# Patient Record
Sex: Female | Born: 1990 | Race: Black or African American | Hispanic: No | Marital: Single | Smoking: Never smoker
Health system: Southern US, Community
[De-identification: ages and names within clinical notes are randomized; demographics above are authoritative.]

## PROBLEM LIST (undated history)

## (undated) DIAGNOSIS — W3400XA Accidental discharge from unspecified firearms or gun, initial encounter: Secondary | ICD-10-CM

## (undated) HISTORY — PX: SPLENECTOMY: SUR1306

## (undated) HISTORY — PX: EXPLORATORY LAPAROTOMY: SUR591

## (undated) HISTORY — PX: NO PAST SURGERIES: SHX2092

## (undated) HISTORY — PX: CHEST TUBE INSERTION: SHX231

---

## 2019-02-09 HISTORY — PX: OTHER SURGICAL HISTORY: SHX169

## 2019-06-14 ENCOUNTER — Ambulatory Visit: Payer: Medicaid - Out of State | Attending: General Surgery | Admitting: Physical Therapy

## 2019-07-01 ENCOUNTER — Encounter (HOSPITAL_COMMUNITY): Payer: Self-pay | Admitting: Emergency Medicine

## 2019-07-01 ENCOUNTER — Other Ambulatory Visit: Payer: Self-pay

## 2019-07-01 ENCOUNTER — Inpatient Hospital Stay (HOSPITAL_COMMUNITY)
Admission: EM | Admit: 2019-07-01 | Discharge: 2019-07-10 | DRG: 981 | Disposition: A | Discharge status: Home / Self Care | Payer: Self-pay | Attending: Internal Medicine | Admitting: Internal Medicine

## 2019-07-01 ENCOUNTER — Emergency Department (HOSPITAL_COMMUNITY): Payer: Self-pay

## 2019-07-01 DIAGNOSIS — Z9889 Other specified postprocedural states: Secondary | ICD-10-CM

## 2019-07-01 DIAGNOSIS — I313 Pericardial effusion (noninflammatory): Secondary | ICD-10-CM | POA: Diagnosis present

## 2019-07-01 DIAGNOSIS — J942 Hemothorax: Secondary | ICD-10-CM | POA: Diagnosis not present

## 2019-07-01 DIAGNOSIS — E876 Hypokalemia: Secondary | ICD-10-CM | POA: Diagnosis present

## 2019-07-01 DIAGNOSIS — Z189 Retained foreign body fragments, unspecified material: Secondary | ICD-10-CM

## 2019-07-01 DIAGNOSIS — J95811 Postprocedural pneumothorax: Secondary | ICD-10-CM | POA: Diagnosis not present

## 2019-07-01 DIAGNOSIS — Y836 Removal of other organ (partial) (total) as the cause of abnormal reaction of the patient, or of later complication, without mention of misadventure at the time of the procedure: Secondary | ICD-10-CM | POA: Diagnosis present

## 2019-07-01 DIAGNOSIS — Z9081 Acquired absence of spleen: Secondary | ICD-10-CM

## 2019-07-01 DIAGNOSIS — Z09 Encounter for follow-up examination after completed treatment for conditions other than malignant neoplasm: Secondary | ICD-10-CM

## 2019-07-01 DIAGNOSIS — T8182XA Emphysema (subcutaneous) resulting from a procedure, initial encounter: Secondary | ICD-10-CM | POA: Diagnosis not present

## 2019-07-01 DIAGNOSIS — J984 Other disorders of lung: Secondary | ICD-10-CM | POA: Diagnosis present

## 2019-07-01 DIAGNOSIS — Z20828 Contact with and (suspected) exposure to other viral communicable diseases: Secondary | ICD-10-CM | POA: Diagnosis present

## 2019-07-01 DIAGNOSIS — R7989 Other specified abnormal findings of blood chemistry: Secondary | ICD-10-CM | POA: Diagnosis not present

## 2019-07-01 DIAGNOSIS — J869 Pyothorax without fistula: Secondary | ICD-10-CM | POA: Diagnosis present

## 2019-07-01 DIAGNOSIS — D62 Acute posthemorrhagic anemia: Secondary | ICD-10-CM | POA: Diagnosis not present

## 2019-07-01 DIAGNOSIS — J939 Pneumothorax, unspecified: Secondary | ICD-10-CM

## 2019-07-01 DIAGNOSIS — T81500A Unspecified complication of foreign body accidentally left in body following surgical operation, initial encounter: Secondary | ICD-10-CM

## 2019-07-01 DIAGNOSIS — J90 Pleural effusion, not elsewhere classified: Secondary | ICD-10-CM

## 2019-07-01 DIAGNOSIS — T81590A Other complications of foreign body accidentally left in body following surgical operation, initial encounter: Principal | ICD-10-CM | POA: Diagnosis present

## 2019-07-01 DIAGNOSIS — J9600 Acute respiratory failure, unspecified whether with hypoxia or hypercapnia: Secondary | ICD-10-CM | POA: Diagnosis not present

## 2019-07-01 HISTORY — DX: Accidental discharge from unspecified firearms or gun, initial encounter: W34.00XA

## 2019-07-01 LAB — CBC
HCT: 39.7 % (ref 36.0–46.0)
Hemoglobin: 12.8 g/dL (ref 12.0–15.0)
MCH: 27.4 pg (ref 26.0–34.0)
MCHC: 32.2 g/dL (ref 30.0–36.0)
MCV: 84.8 fL (ref 80.0–100.0)
Platelets: 459 10*3/uL — ABNORMAL HIGH (ref 150–400)
RBC: 4.68 MIL/uL (ref 3.87–5.11)
RDW: 14.7 % (ref 11.5–15.5)
WBC: 12.9 10*3/uL — ABNORMAL HIGH (ref 4.0–10.5)
nRBC: 0 % (ref 0.0–0.2)

## 2019-07-01 LAB — I-STAT BETA HCG BLOOD, ED (MC, WL, AP ONLY): I-stat hCG, quantitative: <5 m[IU]/mL (ref <5)

## 2019-07-01 MED ORDER — KETOROLAC TROMETHAMINE 30 MG/ML IJ SOLN: 15.0000 mg | Freq: Once | INTRAMUSCULAR | Status: DC
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
## 2019-07-02 ENCOUNTER — Emergency Department (HOSPITAL_COMMUNITY): Payer: Self-pay

## 2019-07-02 ENCOUNTER — Encounter (HOSPITAL_COMMUNITY): Payer: Self-pay | Admitting: Radiology

## 2019-07-02 DIAGNOSIS — J90 Pleural effusion, not elsewhere classified: Secondary | ICD-10-CM

## 2019-07-02 DIAGNOSIS — T81500A Unspecified complication of foreign body accidentally left in body following surgical operation, initial encounter: Secondary | ICD-10-CM | POA: Diagnosis present

## 2019-07-02 LAB — BLOOD GAS, ARTERIAL
Acid-Base Excess: 2.8 mmol/L — ABNORMAL HIGH (ref 0.0–2.0)
Bicarbonate: 26.4 mmol/L (ref 20.0–28.0)
Drawn by: 51702
FIO2: 21
O2 Saturation: 97.3 %
Patient temperature: 37
pCO2 arterial: 37.6 mmHg (ref 32.0–48.0)
pH, Arterial: 7.461 — ABNORMAL HIGH (ref 7.350–7.450)
pO2, Arterial: 88.4 mmHg (ref 83.0–108.0)

## 2019-07-02 LAB — GLUCOSE, CAPILLARY
Glucose-Capillary: 87 mg/dL (ref 70–99)
Glucose-Capillary: 113 mg/dL — ABNORMAL HIGH (ref 70–99)
Glucose-Capillary: 97 mg/dL (ref 70–99)

## 2019-07-02 LAB — COMPREHENSIVE METABOLIC PANEL
ALT: 10 U/L (ref 0–44)
AST: 14 U/L — ABNORMAL LOW (ref 15–41)
Albumin: 3.1 g/dL — ABNORMAL LOW (ref 3.5–5.0)
Alkaline Phosphatase: 60 U/L (ref 38–126)
Anion gap: 9 (ref 5–15)
BUN: <5 mg/dL — ABNORMAL LOW (ref 6–20)
CO2: 24 mmol/L (ref 22–32)
Calcium: 8.7 mg/dL — ABNORMAL LOW (ref 8.9–10.3)
Chloride: 105 mmol/L (ref 98–111)
Creatinine, Ser: 0.75 mg/dL (ref 0.44–1.00)
GFR calc Af Amer: >60 mL/min (ref >60)
GFR calc non Af Amer: >60 mL/min (ref >60)
Glucose, Bld: 96 mg/dL (ref 70–99)
Potassium: 3.8 mmol/L (ref 3.5–5.1)
Sodium: 138 mmol/L (ref 135–145)
Total Bilirubin: 0.5 mg/dL (ref 0.3–1.2)
Total Protein: 6.6 g/dL (ref 6.5–8.1)

## 2019-07-02 LAB — BASIC METABOLIC PANEL
Anion gap: 9 (ref 5–15)
BUN: 9 mg/dL (ref 6–20)
CO2: 29 mmol/L (ref 22–32)
Calcium: 9.4 mg/dL (ref 8.9–10.3)
Chloride: 99 mmol/L (ref 98–111)
Creatinine, Ser: 0.92 mg/dL (ref 0.44–1.00)
GFR calc Af Amer: >60 mL/min (ref >60)
GFR calc non Af Amer: >60 mL/min (ref >60)
Glucose, Bld: 90 mg/dL (ref 70–99)
Potassium: 3.2 mmol/L — ABNORMAL LOW (ref 3.5–5.1)
Sodium: 137 mmol/L (ref 135–145)

## 2019-07-02 LAB — TROPONIN I (HIGH SENSITIVITY)
Troponin I (High Sensitivity): 3 ng/L (ref <18)
Troponin I (High Sensitivity): 3 ng/L (ref <18)

## 2019-07-02 LAB — CBC
HCT: 33.5 % — ABNORMAL LOW (ref 36.0–46.0)
Hemoglobin: 11 g/dL — ABNORMAL LOW (ref 12.0–15.0)
MCH: 27.6 pg (ref 26.0–34.0)
MCHC: 32.8 g/dL (ref 30.0–36.0)
MCV: 84.2 fL (ref 80.0–100.0)
Platelets: 410 10*3/uL — ABNORMAL HIGH (ref 150–400)
RBC: 3.98 MIL/uL (ref 3.87–5.11)
RDW: 14.8 % (ref 11.5–15.5)
WBC: 13 10*3/uL — ABNORMAL HIGH (ref 4.0–10.5)
nRBC: 0 % (ref 0.0–0.2)

## 2019-07-02 LAB — APTT: aPTT: 36 seconds (ref 24–36)

## 2019-07-02 LAB — ABO/RH: ABO/RH(D): O POS

## 2019-07-02 LAB — HIV ANTIBODY (ROUTINE TESTING W REFLEX): HIV Screen 4th Generation wRfx: NONREACTIVE

## 2019-07-02 LAB — MRSA PCR SCREENING: MRSA by PCR: NEGATIVE

## 2019-07-02 LAB — PROTIME-INR
INR: 1.1 (ref 0.8–1.2)
Prothrombin Time: 14.5 seconds (ref 11.4–15.2)

## 2019-07-02 LAB — SARS CORONAVIRUS 2 (TAT 6-24 HRS): SARS Coronavirus 2: NEGATIVE

## 2019-07-02 MED ORDER — ACETAMINOPHEN 325 MG PO TABS: 650.0000 mg | ORAL_TABLET | Freq: Four times a day (QID) | ORAL | Status: DC | PRN

## 2019-07-02 MED ORDER — KETOROLAC TROMETHAMINE 30 MG/ML IJ SOLN
30.0000 mg | Freq: Four times a day (QID) | INTRAMUSCULAR | Status: AC
  Administered 2019-07-03 – 2019-07-07 (×17): 30 mg via INTRAVENOUS
  Filled 2019-07-02 (×17): qty 1

## 2019-07-02 MED ORDER — ONDANSETRON HCL 4 MG/2ML IJ SOLN: 4.0000 mg | Freq: Four times a day (QID) | INTRAMUSCULAR | Status: DC | PRN

## 2019-07-02 MED ORDER — POTASSIUM CHLORIDE CRYS ER 20 MEQ PO TBCR
40.0000 meq | EXTENDED_RELEASE_TABLET | Freq: Once | ORAL | Status: AC
  Administered 2019-07-02: 12:00:00 40 meq via ORAL
  Filled 2019-07-02: qty 2

## 2019-07-02 MED ORDER — ACETAMINOPHEN 650 MG RE SUPP: 650.0000 mg | Freq: Four times a day (QID) | RECTAL | Status: DC | PRN

## 2019-07-02 MED ORDER — SODIUM CHLORIDE 0.9 % IV BOLUS
1000.0000 mL | Freq: Once | INTRAVENOUS | Status: AC
  Administered 2019-07-02: 04:00:00 1000 mL via INTRAVENOUS

## 2019-07-02 MED ORDER — FENTANYL CITRATE (PF) 100 MCG/2ML IJ SOLN
50.0000 ug | Freq: Once | INTRAMUSCULAR | Status: AC
  Administered 2019-07-02: 01:00:00 50 ug via INTRAVENOUS
  Filled 2019-07-02: qty 2

## 2019-07-02 MED ORDER — OXYCODONE HCL 5 MG PO TABS
5.0000 mg | ORAL_TABLET | ORAL | Status: DC | PRN
  Administered 2019-07-06 – 2019-07-10 (×10): 5 mg via ORAL
  Filled 2019-07-02 (×11): qty 1

## 2019-07-02 MED ORDER — ONDANSETRON HCL 4 MG PO TABS: 4.0000 mg | ORAL_TABLET | Freq: Four times a day (QID) | ORAL | Status: DC | PRN

## 2019-07-02 MED ORDER — KETOROLAC TROMETHAMINE 30 MG/ML IJ SOLN
30.0000 mg | Freq: Once | INTRAMUSCULAR | Status: AC
  Administered 2019-07-02: 01:00:00 30 mg via INTRAVENOUS
  Filled 2019-07-02: qty 1

## 2019-07-02 MED ORDER — FENTANYL CITRATE (PF) 100 MCG/2ML IJ SOLN
50.0000 ug | INTRAMUSCULAR | Status: DC | PRN
  Administered 2019-07-02: 04:00:00 50 ug via INTRAVENOUS
  Filled 2019-07-02: qty 2

## 2019-07-02 MED ORDER — HYDROMORPHONE HCL 1 MG/ML IJ SOLN: 0.5000 mg | INTRAMUSCULAR | Status: DC | PRN

## 2019-07-02 MED ORDER — IOHEXOL 350 MG/ML SOLN
75.0000 mL | Freq: Once | INTRAVENOUS | Status: AC | PRN
  Administered 2019-07-02: 01:00:00 75 mL via INTRAVENOUS

## 2019-07-02 MED ORDER — KCL IN DEXTROSE-NACL 20-5-0.9 MEQ/L-%-% IV SOLN
INTRAVENOUS | Status: DC
  Administered 2019-07-02: 100 mL/h via INTRAVENOUS
  Filled 2019-07-02 (×3): qty 1000

## 2019-07-02 MED ORDER — SODIUM CHLORIDE 0.9 % IV SOLN: INTRAVENOUS | Status: DC

## 2019-07-02 MED ORDER — VANCOMYCIN HCL IN DEXTROSE 1-5 GM/200ML-% IV SOLN
1000.0000 mg | Freq: Once | INTRAVENOUS | Status: AC
  Administered 2019-07-02: 06:00:00 1000 mg via INTRAVENOUS
  Filled 2019-07-02: qty 200

## 2019-07-02 MED ORDER — PIPERACILLIN-TAZOBACTAM 3.375 G IVPB 30 MIN
3.3750 g | Freq: Once | INTRAVENOUS | Status: AC
  Administered 2019-07-02: 04:00:00 3.375 g via INTRAVENOUS
  Filled 2019-07-02: qty 50

## 2019-07-02 MED ORDER — CEFAZOLIN SODIUM-DEXTROSE 2-4 GM/100ML-% IV SOLN
2.0000 g | INTRAVENOUS | Status: AC
  Administered 2019-07-03: 2 g via INTRAVENOUS

## 2019-07-02 MED ORDER — POTASSIUM CHLORIDE CRYS ER 20 MEQ PO TBCR: 30.0000 meq | EXTENDED_RELEASE_TABLET | Freq: Once | ORAL | Status: DC

## 2019-07-02 MED ORDER — LACTATED RINGERS IV BOLUS
1000.0000 mL | Freq: Once | INTRAVENOUS | Status: AC
  Administered 2019-07-02: 1000 mL via INTRAVENOUS
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
## 2019-07-02 NOTE — Plan of Care (Signed)
Patient is progressing as expected and waiting for surgery today.
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
## 2019-07-03 ENCOUNTER — Inpatient Hospital Stay (HOSPITAL_COMMUNITY): Payer: Self-pay | Admitting: Certified Registered"

## 2019-07-03 ENCOUNTER — Encounter (HOSPITAL_COMMUNITY)
Admission: EM | Disposition: A | Discharge status: Home / Self Care | Payer: Self-pay | Source: Home / Self Care | Attending: Internal Medicine

## 2019-07-03 ENCOUNTER — Encounter (HOSPITAL_COMMUNITY): Payer: Self-pay | Admitting: Internal Medicine

## 2019-07-03 ENCOUNTER — Inpatient Hospital Stay (HOSPITAL_COMMUNITY): Payer: Self-pay

## 2019-07-03 DIAGNOSIS — Z189 Retained foreign body fragments, unspecified material: Secondary | ICD-10-CM

## 2019-07-03 DIAGNOSIS — J90 Pleural effusion, not elsewhere classified: Secondary | ICD-10-CM

## 2019-07-03 HISTORY — PX: VIDEO ASSISTED THORACOSCOPY: SHX5073

## 2019-07-03 HISTORY — PX: THORACOTOMY: SHX5074

## 2019-07-03 LAB — CBC
HCT: 33.3 % — ABNORMAL LOW (ref 36.0–46.0)
Hemoglobin: 11 g/dL — ABNORMAL LOW (ref 12.0–15.0)
MCH: 27.6 pg (ref 26.0–34.0)
MCHC: 33 g/dL (ref 30.0–36.0)
MCV: 83.5 fL (ref 80.0–100.0)
Platelets: 395 10*3/uL (ref 150–400)
RBC: 3.99 MIL/uL (ref 3.87–5.11)
RDW: 14.7 % (ref 11.5–15.5)
WBC: 9.2 10*3/uL (ref 4.0–10.5)
nRBC: 0 % (ref 0.0–0.2)

## 2019-07-03 LAB — BASIC METABOLIC PANEL
Anion gap: 8 (ref 5–15)
BUN: <5 mg/dL — ABNORMAL LOW (ref 6–20)
CO2: 25 mmol/L (ref 22–32)
Calcium: 8.7 mg/dL — ABNORMAL LOW (ref 8.9–10.3)
Chloride: 105 mmol/L (ref 98–111)
Creatinine, Ser: 0.72 mg/dL (ref 0.44–1.00)
GFR calc Af Amer: >60 mL/min (ref >60)
GFR calc non Af Amer: >60 mL/min (ref >60)
Glucose, Bld: 100 mg/dL — ABNORMAL HIGH (ref 70–99)
Potassium: 3.9 mmol/L (ref 3.5–5.1)
Sodium: 138 mmol/L (ref 135–145)

## 2019-07-03 SURGERY — VIDEO ASSISTED THORACOSCOPY
Anesthesia: General | Site: Chest | Laterality: Left

## 2019-07-03 MED ORDER — ALBUMIN HUMAN 5 % IV SOLN: INTRAVENOUS | Status: DC | PRN

## 2019-07-03 MED ORDER — DIPHENHYDRAMINE HCL 50 MG/ML IJ SOLN: 12.5000 mg | Freq: Four times a day (QID) | INTRAMUSCULAR | Status: DC | PRN

## 2019-07-03 MED ORDER — SUGAMMADEX SODIUM 200 MG/2ML IV SOLN
INTRAVENOUS | Status: DC | PRN
  Administered 2019-07-03: 100 mg via INTRAVENOUS

## 2019-07-03 MED ORDER — SODIUM CHLORIDE 0.9 % IV SOLN: INTRAVENOUS | Status: DC | PRN

## 2019-07-03 MED ORDER — LIDOCAINE 2% (20 MG/ML) 5 ML SYRINGE
INTRAMUSCULAR | Status: AC
  Filled 2019-07-03: qty 5

## 2019-07-03 MED ORDER — BUPIVACAINE LIPOSOME 1.3 % IJ SUSP
INTRAMUSCULAR | Status: DC | PRN
  Administered 2019-07-03: 60 mL

## 2019-07-03 MED ORDER — MEPERIDINE HCL 25 MG/ML IJ SOLN
INTRAMUSCULAR | Status: AC
  Filled 2019-07-03: qty 1

## 2019-07-03 MED ORDER — SODIUM CHLORIDE 0.9 % IV SOLN: INTRAVENOUS | Status: DC

## 2019-07-03 MED ORDER — DEXAMETHASONE SODIUM PHOSPHATE 10 MG/ML IJ SOLN
INTRAMUSCULAR | Status: DC | PRN
  Administered 2019-07-03: 10 mg via INTRAVENOUS

## 2019-07-03 MED ORDER — ROCURONIUM BROMIDE 50 MG/5ML IV SOSY
PREFILLED_SYRINGE | INTRAVENOUS | Status: DC | PRN
  Administered 2019-07-03 (×2): 20 mg via INTRAVENOUS
  Administered 2019-07-03: 80 mg via INTRAVENOUS

## 2019-07-03 MED ORDER — HYDROMORPHONE HCL 1 MG/ML IJ SOLN: 0.2500 mg | INTRAMUSCULAR | Status: DC | PRN

## 2019-07-03 MED ORDER — ONDANSETRON HCL 4 MG/2ML IJ SOLN
INTRAMUSCULAR | Status: DC | PRN
  Administered 2019-07-03: 4 mg via INTRAVENOUS

## 2019-07-03 MED ORDER — MIDAZOLAM HCL 5 MG/5ML IJ SOLN
INTRAMUSCULAR | Status: DC | PRN
  Administered 2019-07-03: 2 mg via INTRAVENOUS

## 2019-07-03 MED ORDER — HYDROMORPHONE HCL 1 MG/ML IJ SOLN
INTRAMUSCULAR | Status: AC
  Filled 2019-07-03: qty 0.5

## 2019-07-03 MED ORDER — ROCURONIUM BROMIDE 10 MG/ML (PF) SYRINGE
PREFILLED_SYRINGE | INTRAVENOUS | Status: AC
  Filled 2019-07-03: qty 10

## 2019-07-03 MED ORDER — PHENYLEPHRINE 40 MCG/ML (10ML) SYRINGE FOR IV PUSH (FOR BLOOD PRESSURE SUPPORT)
PREFILLED_SYRINGE | INTRAVENOUS | Status: AC
  Filled 2019-07-03: qty 10

## 2019-07-03 MED ORDER — MEPERIDINE HCL 25 MG/ML IJ SOLN
6.2500 mg | INTRAMUSCULAR | Status: DC | PRN
  Administered 2019-07-03: 12:00:00 6.25 mg via INTRAVENOUS

## 2019-07-03 MED ORDER — ONDANSETRON HCL 4 MG/2ML IJ SOLN
INTRAMUSCULAR | Status: AC
  Filled 2019-07-03: qty 2

## 2019-07-03 MED ORDER — BISACODYL 5 MG PO TBEC
10.0000 mg | DELAYED_RELEASE_TABLET | Freq: Every day | ORAL | Status: DC
  Filled 2019-07-03 (×2): qty 2

## 2019-07-03 MED ORDER — FENTANYL CITRATE (PF) 250 MCG/5ML IJ SOLN
INTRAMUSCULAR | Status: AC
  Filled 2019-07-03: qty 5

## 2019-07-03 MED ORDER — SUCCINYLCHOLINE CHLORIDE 200 MG/10ML IV SOSY
PREFILLED_SYRINGE | INTRAVENOUS | Status: AC
  Filled 2019-07-03: qty 10

## 2019-07-03 MED ORDER — HYDROMORPHONE HCL 1 MG/ML IJ SOLN
INTRAMUSCULAR | Status: DC | PRN
  Administered 2019-07-03: .5 mg via INTRAVENOUS

## 2019-07-03 MED ORDER — CEFAZOLIN SODIUM 1 G IJ SOLR
INTRAMUSCULAR | Status: AC
  Filled 2019-07-03: qty 20

## 2019-07-03 MED ORDER — BUPIVACAINE HCL (PF) 0.5 % IJ SOLN
INTRAMUSCULAR | Status: AC
  Filled 2019-07-03: qty 30

## 2019-07-03 MED ORDER — DEXAMETHASONE SODIUM PHOSPHATE 10 MG/ML IJ SOLN
INTRAMUSCULAR | Status: AC
  Filled 2019-07-03: qty 1

## 2019-07-03 MED ORDER — SENNOSIDES-DOCUSATE SODIUM 8.6-50 MG PO TABS
1.0000 | ORAL_TABLET | Freq: Every day | ORAL | Status: DC
  Filled 2019-07-03: qty 1

## 2019-07-03 MED ORDER — MIDAZOLAM HCL 2 MG/2ML IJ SOLN
INTRAMUSCULAR | Status: AC
  Filled 2019-07-03: qty 2

## 2019-07-03 MED ORDER — ENOXAPARIN SODIUM 40 MG/0.4ML ~~LOC~~ SOLN: 40.0000 mg | Freq: Every day | SUBCUTANEOUS | Status: DC

## 2019-07-03 MED ORDER — BUPIVACAINE LIPOSOME 1.3 % IJ SUSP
20.0000 mL | INTRAMUSCULAR | Status: DC
  Filled 2019-07-03: qty 20

## 2019-07-03 MED ORDER — CHLORHEXIDINE GLUCONATE CLOTH 2 % EX PADS
6.0000 | MEDICATED_PAD | Freq: Every day | CUTANEOUS | Status: DC
  Administered 2019-07-03 – 2019-07-07 (×5): 6 via TOPICAL

## 2019-07-03 MED ORDER — ACETAMINOPHEN 500 MG PO TABS
1000.0000 mg | ORAL_TABLET | Freq: Four times a day (QID) | ORAL | Status: AC
  Administered 2019-07-03 – 2019-07-08 (×18): 1000 mg via ORAL
  Filled 2019-07-03 (×17): qty 2

## 2019-07-03 MED ORDER — CEFAZOLIN SODIUM-DEXTROSE 1-4 GM/50ML-% IV SOLN
1.0000 g | Freq: Three times a day (TID) | INTRAVENOUS | Status: AC
  Administered 2019-07-03 – 2019-07-06 (×9): 1 g via INTRAVENOUS
  Filled 2019-07-03 (×9): qty 50

## 2019-07-03 MED ORDER — SODIUM CHLORIDE 0.9% FLUSH: 9.0000 mL | INTRAVENOUS | Status: DC | PRN

## 2019-07-03 MED ORDER — ACETAMINOPHEN 160 MG/5ML PO SOLN: 1000.0000 mg | Freq: Four times a day (QID) | ORAL | Status: AC

## 2019-07-03 MED ORDER — NALOXONE HCL 0.4 MG/ML IJ SOLN: 0.4000 mg | INTRAMUSCULAR | Status: DC | PRN

## 2019-07-03 MED ORDER — LIDOCAINE 2% (20 MG/ML) 5 ML SYRINGE
INTRAMUSCULAR | Status: DC | PRN
  Administered 2019-07-03: 100 mg via INTRAVENOUS

## 2019-07-03 MED ORDER — DIPHENHYDRAMINE HCL 12.5 MG/5ML PO ELIX
12.5000 mg | ORAL_SOLUTION | Freq: Four times a day (QID) | ORAL | Status: DC | PRN
  Filled 2019-07-03: qty 5

## 2019-07-03 MED ORDER — FENTANYL CITRATE (PF) 100 MCG/2ML IJ SOLN
INTRAMUSCULAR | Status: DC | PRN
  Administered 2019-07-03 (×3): 50 ug via INTRAVENOUS
  Administered 2019-07-03: 150 ug via INTRAVENOUS
  Administered 2019-07-03 (×2): 50 ug via INTRAVENOUS
  Administered 2019-07-03: 100 ug via INTRAVENOUS
  Administered 2019-07-03: 50 ug via INTRAVENOUS

## 2019-07-03 MED ORDER — PROPOFOL 10 MG/ML IV BOLUS
INTRAVENOUS | Status: DC | PRN
  Administered 2019-07-03: 110 mg via INTRAVENOUS

## 2019-07-03 MED ORDER — GLYCOPYRROLATE PF 0.2 MG/ML IJ SOSY
PREFILLED_SYRINGE | INTRAMUSCULAR | Status: AC
  Filled 2019-07-03: qty 1

## 2019-07-03 MED ORDER — ONDANSETRON HCL 4 MG/2ML IJ SOLN
4.0000 mg | Freq: Four times a day (QID) | INTRAMUSCULAR | Status: DC | PRN
  Administered 2019-07-03 – 2019-07-05 (×2): 4 mg via INTRAVENOUS
  Filled 2019-07-03 (×2): qty 2

## 2019-07-03 MED ORDER — LACTATED RINGERS IV SOLN: INTRAVENOUS | Status: DC | PRN

## 2019-07-03 MED ORDER — PROPOFOL 10 MG/ML IV BOLUS
INTRAVENOUS | Status: AC
  Filled 2019-07-03: qty 20

## 2019-07-03 MED ORDER — HYDROMORPHONE 1 MG/ML IV SOLN
INTRAVENOUS | Status: DC
  Administered 2019-07-03: 0.6 mg via INTRAVENOUS
  Administered 2019-07-03: 30 mg via INTRAVENOUS
  Administered 2019-07-03 – 2019-07-04 (×4): 0.3 mg via INTRAVENOUS
  Administered 2019-07-04 (×3): 0.6 mg via INTRAVENOUS
  Administered 2019-07-04: 0.3 mg via INTRAVENOUS
  Administered 2019-07-05 (×2): 0 mg via INTRAVENOUS
  Administered 2019-07-05 – 2019-07-06 (×4): 0.3 mg via INTRAVENOUS
  Administered 2019-07-06 (×3): 0 mg via INTRAVENOUS
  Administered 2019-07-06 (×2): 0.3 mg via INTRAVENOUS
  Administered 2019-07-07: 0 mg via INTRAVENOUS
  Administered 2019-07-07: 0.3 mg via INTRAVENOUS
  Filled 2019-07-03: qty 30

## 2019-07-03 MED ORDER — PHENYLEPHRINE HCL-NACL 10-0.9 MG/250ML-% IV SOLN
INTRAVENOUS | Status: DC | PRN
  Administered 2019-07-03: 25 ug/min via INTRAVENOUS

## 2019-07-03 MED ORDER — GLYCOPYRROLATE PF 0.2 MG/ML IJ SOSY
PREFILLED_SYRINGE | INTRAMUSCULAR | Status: DC | PRN
  Administered 2019-07-03: .1 mg via INTRAVENOUS

## 2019-07-03 MED ORDER — 0.9 % SODIUM CHLORIDE (POUR BTL) OPTIME
TOPICAL | Status: DC | PRN
  Administered 2019-07-03: 07:00:00 1000 mL

## 2019-07-03 MED ORDER — PHENYLEPHRINE 40 MCG/ML (10ML) SYRINGE FOR IV PUSH (FOR BLOOD PRESSURE SUPPORT)
PREFILLED_SYRINGE | INTRAVENOUS | Status: DC | PRN
  Administered 2019-07-03: 40 ug via INTRAVENOUS

## 2019-07-03 MED ORDER — ONDANSETRON HCL 4 MG/2ML IJ SOLN: 4.0000 mg | Freq: Once | INTRAMUSCULAR | Status: DC | PRN

## 2019-07-03 SURGICAL SUPPLY — 88 items
CANISTER SUCT 3000ML PPV (MISCELLANEOUS) ×8 IMPLANT
CATH THORACIC 28FR (CATHETERS) IMPLANT
CATH THORACIC 28FR RT ANG (CATHETERS) IMPLANT
CATH THORACIC 36FR (CATHETERS) IMPLANT
CATH THORACIC 36FR RT ANG (CATHETERS) IMPLANT
CLIP VESOCCLUDE MED 6/CT (CLIP) ×4 IMPLANT
CONN ST 1/4X3/8  BEN (MISCELLANEOUS) ×2
CONN ST 1/4X3/8 BEN (MISCELLANEOUS) IMPLANT
CONN Y 3/8X3/8X3/8  BEN (MISCELLANEOUS)
CONN Y 3/8X3/8X3/8 BEN (MISCELLANEOUS) ×2 IMPLANT
CONT SPEC 4OZ CLIKSEAL STRL BL (MISCELLANEOUS) ×4 IMPLANT
COVER SURGICAL LIGHT HANDLE (MISCELLANEOUS) ×4 IMPLANT
CUTTER ECHEON FLEX ENDO 45 340 (ENDOMECHANICALS) ×6 IMPLANT
DERMABOND ADHESIVE PROPEN (GAUZE/BANDAGES/DRESSINGS) ×2
DERMABOND ADVANCED (GAUZE/BANDAGES/DRESSINGS) ×2
DERMABOND ADVANCED .7 DNX12 (GAUZE/BANDAGES/DRESSINGS) IMPLANT
DERMABOND ADVANCED .7 DNX6 (GAUZE/BANDAGES/DRESSINGS) IMPLANT
DRAIN CHANNEL 28F RND 3/8 FF (WOUND CARE) ×4 IMPLANT
DRAIN CHANNEL 32F RND 10.7 FF (WOUND CARE) IMPLANT
DRAPE CV SPLIT W-CLR ANES SCRN (DRAPES) ×4 IMPLANT
DRAPE ORTHO SPLIT 77X108 STRL (DRAPES) ×4
DRAPE SLUSH/WARMER DISC (DRAPES) ×4 IMPLANT
DRAPE SURG ORHT 6 SPLT 77X108 (DRAPES) ×2 IMPLANT
ELECT BLADE 6.5 EXT (BLADE) ×4 IMPLANT
ELECT REM PT RETURN 9FT ADLT (ELECTROSURGICAL) ×4
ELECTRODE REM PT RTRN 9FT ADLT (ELECTROSURGICAL) ×2 IMPLANT
FELT TEFLON 1X6 (MISCELLANEOUS) ×2 IMPLANT
GAUZE SPONGE 4X4 12PLY STRL (GAUZE/BANDAGES/DRESSINGS) ×4 IMPLANT
GLOVE BIOGEL PI IND STRL 6.5 (GLOVE) IMPLANT
GLOVE BIOGEL PI INDICATOR 6.5 (GLOVE) ×4
GLOVE SURG SIGNA 7.5 PF LTX (GLOVE) ×12 IMPLANT
GLOVE TRIUMPH SURG SIZE 7.5 (KITS) ×4 IMPLANT
GOWN STRL REUS W/ TWL LRG LVL3 (GOWN DISPOSABLE) ×4 IMPLANT
GOWN STRL REUS W/ TWL XL LVL3 (GOWN DISPOSABLE) ×2 IMPLANT
GOWN STRL REUS W/TWL LRG LVL3 (GOWN DISPOSABLE) ×4
GOWN STRL REUS W/TWL XL LVL3 (GOWN DISPOSABLE) ×2
HEMOSTAT SURGICEL 2X14 (HEMOSTASIS) IMPLANT
IV CATH 22GX1 FEP (IV SOLUTION) IMPLANT
KIT BASIN OR (CUSTOM PROCEDURE TRAY) ×4 IMPLANT
KIT SUCTION CATH 14FR (SUCTIONS) ×4 IMPLANT
KIT TURNOVER KIT B (KITS) ×4 IMPLANT
NDL HYPO 25GX1X1/2 BEV (NEEDLE) ×2 IMPLANT
NDL SPNL 18GX3.5 QUINCKE PK (NEEDLE) IMPLANT
NEEDLE HYPO 25GX1X1/2 BEV (NEEDLE) ×4 IMPLANT
NEEDLE SPNL 18GX3.5 QUINCKE PK (NEEDLE) IMPLANT
NS IRRIG 1000ML POUR BTL (IV SOLUTION) ×12 IMPLANT
PACK CHEST (CUSTOM PROCEDURE TRAY) ×4 IMPLANT
RELOAD STAPLE 45 GOLD REG/THCK (STAPLE) IMPLANT
SOL ANTI FOG 6CC (MISCELLANEOUS) ×2 IMPLANT
SOLUTION ANTI FOG 6CC (MISCELLANEOUS) ×2
SPONGE INTESTINAL PEANUT (DISPOSABLE) ×10 IMPLANT
STAPLE RELOAD 45MM GOLD (STAPLE) ×4 IMPLANT
STAPLER VISISTAT 35W (STAPLE) IMPLANT
STOPCOCK 4 WAY LG BORE MALE ST (IV SETS) ×6 IMPLANT
SUT PROLENE 4 0 RB 1 (SUTURE) ×2
SUT PROLENE 4 0 SH DA (SUTURE) ×2 IMPLANT
SUT PROLENE 4-0 RB1 .5 CRCL 36 (SUTURE) IMPLANT
SUT SILK  1 MH (SUTURE) ×2
SUT SILK 1 MH (SUTURE) ×4 IMPLANT
SUT VIC AB 1 CTX 36 (SUTURE) ×4
SUT VIC AB 1 CTX36XBRD ANBCTR (SUTURE) IMPLANT
SUT VIC AB 2-0 CT1 27 (SUTURE) ×2
SUT VIC AB 2-0 CT1 TAPERPNT 27 (SUTURE) IMPLANT
SUT VIC AB 2-0 CTX 36 (SUTURE) IMPLANT
SUT VIC AB 2-0 UR6 27 (SUTURE) IMPLANT
SUT VIC AB 3-0 SH 18 (SUTURE) IMPLANT
SUT VIC AB 3-0 SH 27 (SUTURE)
SUT VIC AB 3-0 SH 27X BRD (SUTURE) IMPLANT
SUT VIC AB 3-0 X1 27 (SUTURE) ×8 IMPLANT
SUT VICRYL 0 UR6 27IN ABS (SUTURE) ×4 IMPLANT
SUT VICRYL 2 TP 1 (SUTURE) IMPLANT
SWAB COLLECTION DEVICE MRSA (MISCELLANEOUS) ×2 IMPLANT
SWAB CULTURE ESWAB REG 1ML (MISCELLANEOUS) IMPLANT
SYR 10ML LL (SYRINGE) ×4 IMPLANT
SYR 20ML LL LF (SYRINGE) ×2 IMPLANT
SYR 50ML LL SCALE MARK (SYRINGE) ×4 IMPLANT
SYR CONTROL 10ML LL (SYRINGE) ×2 IMPLANT
SYSTEM SAHARA CHEST DRAIN ATS (WOUND CARE) ×4 IMPLANT
TAPE CLOTH 4X10 WHT NS (GAUZE/BANDAGES/DRESSINGS) ×4 IMPLANT
TAPE CLOTH SURG 4X10 WHT LF (GAUZE/BANDAGES/DRESSINGS) ×2 IMPLANT
TIP APPLICATOR SPRAY EXTEND 16 (VASCULAR PRODUCTS) IMPLANT
TOWEL GREEN STERILE (TOWEL DISPOSABLE) ×4 IMPLANT
TOWEL GREEN STERILE FF (TOWEL DISPOSABLE) ×4 IMPLANT
TRAP SPECIMEN MUCOUS 40CC (MISCELLANEOUS) ×2 IMPLANT
TRAY FOLEY SLVR 16FR LF STAT (SET/KITS/TRAYS/PACK) ×4 IMPLANT
TROCAR XCEL BLADELESS 5X75MML (TROCAR) ×4 IMPLANT
TUBING EXTENTION W/L.L. (IV SETS) ×6 IMPLANT
WATER STERILE IRR 1000ML POUR (IV SOLUTION) ×8 IMPLANT

## 2019-07-03 NOTE — Anesthesia Procedure Notes (Signed)
Anesthesia Procedure Image    

## 2019-07-03 NOTE — Brief Op Note (Addendum)
07/01/2019 - 07/03/2019  11:27 AM  PATIENT:  Cassandra Bowen  28 y.o. female  PRE-OPERATIVE DIAGNOSIS:  Left pleural effusion, possible retained foreign body  POST-OPERATIVE DIAGNOSIS:  Left pleural effusion, retained foreign body  PROCEDURE:  Procedure(s): -Left VATS, thoracotomy, drainage of pleural effusion, removal of foreign body with decortication -Intercostal nerve block   SURGEON:  Surgeon(s) and Role:    [name], MD - Primary  PHYSICIAN ASSISTANT: [place]  ANESTHESIA:   general  EBL:  200 mL   BLOOD ADMINISTERED:none  DRAINS: 31fr Blake drains x 2   LOCAL MEDICATIONS USED:  Exparel  SPECIMEN: Pleural peel, foreign body  DISPOSITION OF SPECIMEN:  PATHOLOGY  COUNTS:  YES  DICTATION: .Dragon Dictation  PLAN OF CARE: Admit to inpatient   PATIENT DISPOSITION:  PACU - hemodynamically stable.   Delay start of Pharmacological VTE agent (>24hrs) due to surgical blood loss or risk of bleeding: yes

## 2019-07-03 NOTE — Anesthesia Procedure Notes (Addendum)
Procedure Name: Intubation Date/Time: 07/03/2019 7:52 AM Performed by: Orlie Dakin, CRNA Pre-anesthesia Checklist: Patient identified, Emergency Drugs available, Suction available, Patient being monitored and Timeout performed Patient Re-evaluated:Patient Re-evaluated prior to induction Oxygen Delivery Method: Circle system utilized Preoxygenation: Pre-oxygenation with 100% oxygen Induction Type: IV induction Ventilation: Mask ventilation without difficulty Laryngoscope Size: Mac and 3 Grade View: Grade I Tube type: Oral Endobronchial tube: Left, Double lumen EBT and EBT position confirmed by auscultation and 35 Fr Number of attempts: 1 Airway Equipment and Method: Stylet and Fiberoptic brochoscope Placement Confirmation: ETT inserted through vocal cords under direct vision,  positive ETCO2 and breath sounds checked- equal and bilateral Tube secured with: Tape Dental Injury: Teeth and Oropharynx as per pre-operative assessment  Comments: Viva-sight EBT with camera used.

## 2019-07-03 NOTE — Anesthesia Procedure Notes (Signed)
Central Venous Catheter Insertion Performed by: Myrtie Soman, MD, anesthesiologist Start/End12/23/2020 6:55 AM, 07/03/2019 7:11 AM Patient location: Pre-op. Preanesthetic checklist: patient identified, IV checked, site marked, risks and benefits discussed, surgical consent, monitors and equipment checked, pre-op evaluation, timeout performed and anesthesia consent Lidocaine 1% used for infiltration and patient sedated Hand hygiene performed  and maximum sterile barriers used  Catheter size: 8 Fr Total catheter length 16. Central line was placed.Double lumen Procedure performed using ultrasound guided technique. Ultrasound Notes:anatomy identified, needle tip was noted to be adjacent to the nerve/plexus identified, no ultrasound evidence of intravascular and/or intraneural injection and image(s) printed for medical record Attempts: 1 Following insertion, dressing applied, line sutured and Biopatch. Post procedure assessment: blood return through all ports  Patient tolerated the procedure well with no immediate complications.

## 2019-07-03 NOTE — Anesthesia Postprocedure Evaluation (Signed)
Anesthesia Post Note  Patient: Ellamae Lybeck  Procedure(s) Performed: Left VATS, thoracotomy, drainage of pleural effusion, removal of foreign body (Left Chest) possible THORACOTOMY (Left )     Patient location during evaluation: PACU Anesthesia Type: General Level of consciousness: awake and alert Pain management: pain level controlled Vital Signs Assessment: post-procedure vital signs reviewed and stable Respiratory status: spontaneous breathing, nonlabored ventilation, respiratory function stable and patient connected to nasal cannula oxygen Cardiovascular status: blood pressure returned to baseline and stable Postop Assessment: no apparent nausea or vomiting Anesthetic complications: no    Last Vitals:  Vitals:   07/03/19 1515 07/03/19 1535  BP: 96/67 (!) 103/57  Pulse:    Resp: 20 (!) 22  Temp:    SpO2:      Last Pain:  Vitals:   07/03/19 1408  TempSrc:   PainSc: 0-No pain                 Ozie Dimaria DAVID

## 2019-07-03 NOTE — Progress Notes (Signed)
Patient report was called to OR Agricultural consultant at [phone]. Consent was signed and on the patient's chart,

## 2019-07-03 NOTE — Transfer of Care (Signed)
Immediate Anesthesia Transfer of Care Note  Patient: Cassandra Bowen  Procedure(s) Performed: Left VATS, thoracotomy, drainage of pleural effusion, removal of foreign body (Left Chest) possible THORACOTOMY (Left )  Patient Location: PACU  Anesthesia Type:General  Level of Consciousness: awake and patient cooperative  Airway & Oxygen Therapy: Patient Spontanous Breathing and Patient connected to face mask oxygen  Post-op Assessment: Report given to RN and Post -op Vital signs reviewed and stable  Post vital signs: Reviewed and stable  Last Vitals:  Vitals Value Taken Time  BP 101/64 07/03/19 1157  Temp    Pulse 114 07/03/19 1200  Resp 16 07/03/19 1200  SpO2 100 % 07/03/19 1200  Vitals shown include unvalidated device data.  Last Pain:  Vitals:   07/03/19 0350  TempSrc: Oral  PainSc:          Complications: No apparent anesthesia complications

## 2019-07-03 NOTE — Interval H&P Note (Signed)
History and Physical Interval Note:  07/03/2019 7:26 AM  Cassandra Bowen  has presented today for surgery, with the diagnosis of LEFT PLEURAL EFFUSION.  The various methods of treatment have been discussed with the patient and family. After consideration of risks, benefits and other options for treatment, the patient has consented to  Procedure(s): VIDEO ASSISTED THORACOSCOPY (Left) DRAINAGE OF PLEURAL EFFUSION (Left) possible THORACOTOMY (Left) as a surgical intervention.  [name] history has been reviewed, patient examined, no change in status, stable for surgery.  I have reviewed the patient's chart and labs.  Questions were answered to the patient's satisfaction.     [name]

## 2019-07-03 NOTE — Anesthesia Procedure Notes (Addendum)
Arterial Line Insertion Start/End12/23/2020 7:13 AM, 07/03/2019 7:20 AM Performed by: Myrtie Soman, MD  Patient location: Pre-op. Preanesthetic checklist: patient identified, IV checked, site marked, risks and benefits discussed, surgical consent, monitors and equipment checked, pre-op evaluation, timeout performed and anesthesia consent Lidocaine 1% used for infiltration Left, radial was placed Catheter size: 20 Fr Hand hygiene performed  and maximum sterile barriers used   Attempts: 3 Procedure performed using ultrasound guided technique. Ultrasound Notes:anatomy identified, needle tip was noted to be adjacent to the nerve/plexus identified, no ultrasound evidence of intravascular and/or intraneural injection and image(s) printed for medical record Following insertion, dressing applied. Post procedure assessment: normal and unchanged  Patient tolerated the procedure well with no immediate complications. Additional procedure comments: crna attempt x 2 [place] used by me for L radial placement.

## 2019-07-03 NOTE — Anesthesia Preprocedure Evaluation (Addendum)
Anesthesia Evaluation  Patient identified by MRN, date of birth, ID band Patient awake    Reviewed: Allergy & Precautions, NPO status , Patient's Chart, lab work & pertinent test results  Airway Mallampati: I  TM Distance: >3 FB Neck ROM: Full    Dental  (+) Dental Advisory Given, Teeth Intact   Pulmonary    Pulmonary exam normal        Cardiovascular Normal cardiovascular exam     Neuro/Psych    GI/Hepatic   Endo/Other    Renal/GU      Musculoskeletal   Abdominal   Peds  Hematology   Anesthesia Other Findings   Reproductive/Obstetrics                            Anesthesia Physical Anesthesia Plan  ASA: III  Anesthesia Plan: General   Post-op Pain Management:    Induction: Intravenous  PONV Risk Score and Plan: 3 and Midazolam, Ondansetron and Dexamethasone  Airway Management Planned: Double Lumen EBT  Additional Equipment: Arterial line, Ultrasound Guidance Line Placement and CVP  Intra-op Plan:   Post-operative Plan: Extubation in OR  Informed Consent: I have reviewed the patients History and Physical, chart, labs and discussed the procedure including the risks, benefits and alternatives for the proposed anesthesia with the patient or authorized representative who has indicated his/her understanding and acceptance.       Plan Discussed with: CRNA and Surgeon  Anesthesia Plan Comments:         Anesthesia Quick Evaluation

## 2019-07-03 NOTE — OR Nursing (Addendum)
Two laparotomy sponges removed intraoperatively from left pleural space and sent to pathology; left in place from previous surgery; Post op portable chest Xray performed in OR; negative for foreign body per Dr. Roxan Hockey

## 2019-07-03 NOTE — Plan of Care (Signed)
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
## 2019-07-04 ENCOUNTER — Inpatient Hospital Stay (HOSPITAL_COMMUNITY): Payer: Self-pay

## 2019-07-04 LAB — BASIC METABOLIC PANEL
Anion gap: 9 (ref 5–15)
BUN: 5 mg/dL — ABNORMAL LOW (ref 6–20)
CO2: 25 mmol/L (ref 22–32)
Calcium: 8.5 mg/dL — ABNORMAL LOW (ref 8.9–10.3)
Chloride: 104 mmol/L (ref 98–111)
Creatinine, Ser: 0.55 mg/dL (ref 0.44–1.00)
GFR calc Af Amer: >60 mL/min (ref >60)
GFR calc non Af Amer: >60 mL/min (ref >60)
Glucose, Bld: 107 mg/dL — ABNORMAL HIGH (ref 70–99)
Potassium: 3.7 mmol/L (ref 3.5–5.1)
Sodium: 138 mmol/L (ref 135–145)

## 2019-07-04 LAB — BLOOD GAS, ARTERIAL
Acid-Base Excess: 4.3 mmol/L — ABNORMAL HIGH (ref 0.0–2.0)
Bicarbonate: 28.3 mmol/L — ABNORMAL HIGH (ref 20.0–28.0)
FIO2: 21
O2 Saturation: 98.2 %
Patient temperature: 37
pCO2 arterial: 42.4 mmHg (ref 32.0–48.0)
pH, Arterial: 7.44 (ref 7.350–7.450)
pO2, Arterial: 106 mmHg (ref 83.0–108.0)

## 2019-07-04 LAB — CBC
HCT: 20.8 % — ABNORMAL LOW (ref 36.0–46.0)
Hemoglobin: 6.8 g/dL — CL (ref 12.0–15.0)
MCH: 28.1 pg (ref 26.0–34.0)
MCHC: 32.7 g/dL (ref 30.0–36.0)
MCV: 86 fL (ref 80.0–100.0)
Platelets: 282 10*3/uL (ref 150–400)
RBC: 2.42 MIL/uL — ABNORMAL LOW (ref 3.87–5.11)
RDW: 14.9 % (ref 11.5–15.5)
WBC: 14.3 10*3/uL — ABNORMAL HIGH (ref 4.0–10.5)
nRBC: 0 % (ref 0.0–0.2)

## 2019-07-04 LAB — PREPARE RBC (CROSSMATCH)

## 2019-07-04 MED ORDER — SODIUM CHLORIDE 0.9% IV SOLUTION: Freq: Once | INTRAVENOUS | Status: DC

## 2019-07-04 MED ORDER — ENOXAPARIN SODIUM 40 MG/0.4ML ~~LOC~~ SOLN
40.0000 mg | Freq: Every day | SUBCUTANEOUS | Status: DC
  Administered 2019-07-04 – 2019-07-08 (×5): 40 mg via SUBCUTANEOUS
  Filled 2019-07-04 (×6): qty 0.4
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
## 2019-07-04 NOTE — Progress Notes (Signed)
Removed F-cath and A-line. No complications. HS [employer]
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
## 2019-07-04 NOTE — Progress Notes (Signed)
Final unit of PRBC completed at approximately 2040.  Vital signs obtained within 30 minutes of blood infusions completion. No abnormal vital signs noted nor any adverse reactions. Patient states she feels "fine".  Blood bank information placed in shadow chart.  Will continue to monitor patient for any adverse transfusion reactions.

## 2019-07-05 ENCOUNTER — Inpatient Hospital Stay (HOSPITAL_COMMUNITY): Payer: Self-pay

## 2019-07-05 LAB — BPAM RBC
Blood Product Expiration Date: 202101232359
Blood Product Expiration Date: 202101232359
ISSUE DATE / TIME: 202012241414
ISSUE DATE / TIME: 202012241707
Unit Type and Rh: 5100
Unit Type and Rh: 5100

## 2019-07-05 LAB — COMPREHENSIVE METABOLIC PANEL
ALT: 8 U/L (ref 0–44)
AST: 15 U/L (ref 15–41)
Albumin: 2.5 g/dL — ABNORMAL LOW (ref 3.5–5.0)
Alkaline Phosphatase: 39 U/L (ref 38–126)
Anion gap: 9 (ref 5–15)
BUN: 5 mg/dL — ABNORMAL LOW (ref 6–20)
CO2: 27 mmol/L (ref 22–32)
Calcium: 8.1 mg/dL — ABNORMAL LOW (ref 8.9–10.3)
Chloride: 104 mmol/L (ref 98–111)
Creatinine, Ser: 0.6 mg/dL (ref 0.44–1.00)
GFR calc Af Amer: >60 mL/min (ref >60)
GFR calc non Af Amer: >60 mL/min (ref >60)
Glucose, Bld: 90 mg/dL (ref 70–99)
Potassium: 3.2 mmol/L — ABNORMAL LOW (ref 3.5–5.1)
Sodium: 140 mmol/L (ref 135–145)
Total Bilirubin: 0.2 mg/dL — ABNORMAL LOW (ref 0.3–1.2)
Total Protein: 4.8 g/dL — ABNORMAL LOW (ref 6.5–8.1)

## 2019-07-05 LAB — TYPE AND SCREEN
ABO/RH(D): O POS
Antibody Screen: NEGATIVE
Unit division: 0
Unit division: 0

## 2019-07-05 LAB — CBC
HCT: 27 % — ABNORMAL LOW (ref 36.0–46.0)
Hemoglobin: 9.3 g/dL — ABNORMAL LOW (ref 12.0–15.0)
MCH: 29.2 pg (ref 26.0–34.0)
MCHC: 34.4 g/dL (ref 30.0–36.0)
MCV: 84.6 fL (ref 80.0–100.0)
Platelets: 279 10*3/uL (ref 150–400)
RBC: 3.19 MIL/uL — ABNORMAL LOW (ref 3.87–5.11)
RDW: 14.6 % (ref 11.5–15.5)
WBC: 11.7 10*3/uL — ABNORMAL HIGH (ref 4.0–10.5)
nRBC: 0.2 % (ref 0.0–0.2)

## 2019-07-05 MED ORDER — POTASSIUM CHLORIDE CRYS ER 20 MEQ PO TBCR
40.0000 meq | EXTENDED_RELEASE_TABLET | Freq: Two times a day (BID) | ORAL | Status: AC
  Administered 2019-07-05 (×2): 40 meq via ORAL
  Filled 2019-07-05 (×2): qty 2

## 2019-07-05 MED ORDER — POTASSIUM CHLORIDE CRYS ER 20 MEQ PO TBCR
40.0000 meq | EXTENDED_RELEASE_TABLET | Freq: Once | ORAL | Status: AC
  Administered 2019-07-05: 16:00:00 40 meq via ORAL
  Filled 2019-07-05: qty 2

## 2019-07-05 MED ORDER — POTASSIUM CHLORIDE CRYS ER 20 MEQ PO TBCR
30.0000 meq | EXTENDED_RELEASE_TABLET | Freq: Once | ORAL | Status: AC
  Administered 2019-07-05: 30 meq via ORAL
  Filled 2019-07-05: qty 1
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
## 2019-07-05 NOTE — Plan of Care (Signed)

## 2019-07-06 ENCOUNTER — Inpatient Hospital Stay (HOSPITAL_COMMUNITY): Payer: Self-pay

## 2019-07-06 NOTE — Progress Notes (Signed)
Contacted [facility] on-call provider regarding to verify whether patient is in need for lab orders this AM. Patient currently has none ordered.  Awaiting confirmation at this time.
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
## 2019-07-06 NOTE — Progress Notes (Signed)
[address]       [place] [address]             [phone]      3 Days Post-Op Procedure(s) (LRB): Left VATS, thoracotomy, drainage of pleural effusion, removal of foreign body (Left) possible THORACOTOMY (Left) Subjective: Much bigger air leak today, was on H2O seal- will place to 10 cm suction  Objective: Vital signs in last 24 hours: Temp:  [97.8 F (36.6 C)-98.2 F (36.8 C)] 98.2 F (36.8 C) (12/26 0917) Pulse Rate:  [82-107] 86 (12/26 0917) Cardiac Rhythm: Normal sinus rhythm (12/26 0817) Resp:  [16-26] 19 (12/26 0917) BP: (96-112)/(64-74) 105/72 (12/26 0917) SpO2:  [90 %-100 %] 100 % (12/26 0917)  Hemodynamic parameters for last 24 hours:    Intake/Output from previous day: 12/25 0701 - 12/26 0700 In: 1181.3 [P.O.:600; I.V.:431.3; IV Piggyback:150] Out: 110 [Chest Tube:110] Intake/Output this shift: Total I/O In: 67 [I.V.:67] Out: 0   General appearance: alert, cooperative and no distress Heart: regular rate and rhythm Lungs: clear to auscultation bilaterally and + sq air Abdomen: benign Extremities: no edema or calf tenderness Wound: incis healing well  Lab Results: Recent Labs    07/04/19 0410 07/05/19 0402  WBC 14.3* 11.7*  HGB 6.8* 9.3*  HCT 20.8* 27.0*  PLT 282 279   BMET:  Recent Labs    07/04/19 0410 07/05/19 0402  NA 138 140  K 3.7 3.2*  CL 104 104  CO2 25 27  GLUCOSE 107* 90  BUN 5* 5*  CREATININE 0.55 0.60  CALCIUM 8.5* 8.1*    PT/INR: No results for input(s): LABPROT, INR in the last 72 hours. ABG    Component Value Date/Time   PHART 7.440 07/04/2019 0142   HCO3 28.3 (H) 07/04/2019 0142   O2SAT 98.2 07/04/2019 0142   CBG (last 3)  No results for input(s): GLUCAP in the last 72 hours.  Meds Scheduled Meds: . sodium chloride   Intravenous Once  . acetaminophen  1,000 mg Oral Q6H   Or  . acetaminophen (TYLENOL) oral liquid 160 mg/5 mL  1,000 mg Oral Q6H  . bisacodyl  10 mg Oral Daily  .  Chlorhexidine Gluconate Cloth  6 each Topical Daily  . enoxaparin (LOVENOX) injection  40 mg Subcutaneous Daily  . HYDROmorphone   Intravenous Q4H  . ketorolac  30 mg Intravenous Q6H  . senna-docusate  1 tablet Oral QHS   Continuous Infusions: . sodium chloride 20 mL/hr at 07/06/19 0800   PRN Meds:.diphenhydrAMINE **OR** diphenhydrAMINE, naloxone **AND** sodium chloride flush, ondansetron (ZOFRAN) IV, oxyCODONE  Xrays DG Chest 2 View  Result Date: 07/06/2019 CLINICAL DATA:  Status post left VATS, foreign body removal and decortication. EXAM: CHEST - 2 VIEW COMPARISON:  07/05/2019 FINDINGS: Stable positioning of 2 left-sided chest tubes and right-sided central line. It is difficult to appreciate a pneumothorax in the frontal projection but an anterior pneumothorax is visible in the lateral projection likely around 10-15% volume. Stable residual atelectasis of the left lower lung. The right lung remains clear. IMPRESSION: Anterior pneumothorax in the lateral projection roughly 10-15% volume. Stable residual atelectasis of the left lower lung. Electronically Signed   By: Aletta Edouard M.D.   On: 07/06/2019 08:57   DG Chest Port 1 View  Result Date: 07/05/2019 CLINICAL DATA:  Status post left VATS, foreign body removal and decortication. EXAM: PORTABLE CHEST 1 VIEW COMPARISON:  07/04/2019 FINDINGS: Stable heart size. Stable positioning of right jugular central  line and 2 separate left-sided chest tubes. No further definite pneumothorax visualized. There remains atelectasis much of the mid to lower left lung. Stable to slightly less prominent subcutaneous air in the left chest wall and base of left neck. The right lung remains normally aerated. IMPRESSION: No further definite left pneumothorax visualized. Stable atelectasis of the mid to lower left lung. Electronically Signed   By: Irish Lack M.D.   On: 07/05/2019 09:36    Assessment/Plan: S/P Procedure(s) (LRB): Left VATS, thoracotomy,  drainage of pleural effusion, removal of foreign body (Left) possible THORACOTOMY (Left)  1 large air leak today, more sq air- place to 10 cm H2O suction- keep both tubes for now 2 no new labs 3 medical management per [facility]ospitalists 4 afebrile, VSS  LOS: 4 days    Rowe Clack [facility] 07/06/2019 Pager [phone]

## 2019-07-07 ENCOUNTER — Inpatient Hospital Stay (HOSPITAL_COMMUNITY): Payer: Self-pay

## 2019-07-07 LAB — BASIC METABOLIC PANEL
Anion gap: 6 (ref 5–15)
BUN: 9 mg/dL (ref 6–20)
CO2: 26 mmol/L (ref 22–32)
Calcium: 8.5 mg/dL — ABNORMAL LOW (ref 8.9–10.3)
Chloride: 103 mmol/L (ref 98–111)
Creatinine, Ser: 0.59 mg/dL (ref 0.44–1.00)
GFR calc Af Amer: >60 mL/min (ref >60)
GFR calc non Af Amer: >60 mL/min (ref >60)
Glucose, Bld: 88 mg/dL (ref 70–99)
Potassium: 3.8 mmol/L (ref 3.5–5.1)
Sodium: 135 mmol/L (ref 135–145)

## 2019-07-07 LAB — CBC
HCT: 31.7 % — ABNORMAL LOW (ref 36.0–46.0)
Hemoglobin: 10.8 g/dL — ABNORMAL LOW (ref 12.0–15.0)
MCH: 29.6 pg (ref 26.0–34.0)
MCHC: 34.1 g/dL (ref 30.0–36.0)
MCV: 86.8 fL (ref 80.0–100.0)
Platelets: 444 10*3/uL — ABNORMAL HIGH (ref 150–400)
RBC: 3.65 MIL/uL — ABNORMAL LOW (ref 3.87–5.11)
RDW: 14.5 % (ref 11.5–15.5)
WBC: 9.9 10*3/uL (ref 4.0–10.5)
nRBC: 0.4 % — ABNORMAL HIGH (ref 0.0–0.2)

## 2019-07-07 LAB — CULTURE, BLOOD (ROUTINE X 2)
Culture: NO GROWTH
Culture: NO GROWTH
Special Requests: ADEQUATE

## 2019-07-07 MED ORDER — TRAMADOL HCL 50 MG PO TABS
50.0000 mg | ORAL_TABLET | Freq: Four times a day (QID) | ORAL | Status: DC | PRN
  Administered 2019-07-07: 18:00:00 50 mg via ORAL
  Filled 2019-07-07: qty 1

## 2019-07-07 NOTE — Progress Notes (Signed)
Devona Konig RN aware of d/c CVC order.

## 2019-07-07 NOTE — Progress Notes (Signed)
[address]       [place] [address]             [phone]      4 Days Post-Op Procedure(s) (LRB): Left VATS, thoracotomy, drainage of pleural effusion, removal of foreign body (Left) possible THORACOTOMY (Left) Subjective: Feels ok, tachy with activity  Objective: Vital signs in last 24 hours: Temp:  [97.8 F (36.6 C)-98.5 F (36.9 C)] 98 F (36.7 C) (12/27 0756) Pulse Rate:  [89-111] 93 (12/27 0756) Cardiac Rhythm: Normal sinus rhythm (12/27 0756) Resp:  [17-26] 18 (12/27 0756) BP: (99-113)/(60-76) 103/60 (12/27 0756) SpO2:  [96 %-100 %] 99 % (12/27 0756)  Hemodynamic parameters for last 24 hours:    Intake/Output from previous day: 12/26 0701 - 12/27 0700 In: 547 [P.O.:480; I.V.:67] Out: 50 [Chest Tube:50] Intake/Output this shift: No intake/output data recorded.  General appearance: alert, cooperative and no distress Heart: regular rate and rhythm and tachy Lungs: mildly dim left base Abdomen: benign Extremities: no edema Wound: incis healing well  Lab Results: Recent Labs    07/05/19 0402 07/07/19 0423  WBC 11.7* 9.9  HGB 9.3* 10.8*  HCT 27.0* 31.7*  PLT 279 444*   BMET:  Recent Labs    07/05/19 0402 07/07/19 0423  NA 140 135  K 3.2* 3.8  CL 104 103  CO2 27 26  GLUCOSE 90 88  BUN 5* 9  CREATININE 0.60 0.59  CALCIUM 8.1* 8.5*    PT/INR: No results for input(s): LABPROT, INR in the last 72 hours. ABG    Component Value Date/Time   PHART 7.440 07/04/2019 0142   HCO3 28.3 (H) 07/04/2019 0142   O2SAT 98.2 07/04/2019 0142   CBG (last 3)  No results for input(s): GLUCAP in the last 72 hours.  Meds Scheduled Meds: . sodium chloride   Intravenous Once  . acetaminophen  1,000 mg Oral Q6H   Or  . acetaminophen (TYLENOL) oral liquid 160 mg/5 mL  1,000 mg Oral Q6H  . bisacodyl  10 mg Oral Daily  . Chlorhexidine Gluconate Cloth  6 each Topical Daily  . enoxaparin (LOVENOX) injection  40 mg Subcutaneous Daily  .  HYDROmorphone   Intravenous Q4H  . ketorolac  30 mg Intravenous Q6H  . senna-docusate  1 tablet Oral QHS   Continuous Infusions: . sodium chloride 20 mL/hr at 07/06/19 0800   PRN Meds:.diphenhydrAMINE **OR** diphenhydrAMINE, naloxone **AND** sodium chloride flush, ondansetron (ZOFRAN) IV, oxyCODONE  Xrays DG Chest 2 View  Result Date: 07/06/2019 CLINICAL DATA:  Status post left VATS, foreign body removal and decortication. EXAM: CHEST - 2 VIEW COMPARISON:  07/05/2019 FINDINGS: Stable positioning of 2 left-sided chest tubes and right-sided central line. It is difficult to appreciate a pneumothorax in the frontal projection but an anterior pneumothorax is visible in the lateral projection likely around 10-15% volume. Stable residual atelectasis of the left lower lung. The right lung remains clear. IMPRESSION: Anterior pneumothorax in the lateral projection roughly 10-15% volume. Stable residual atelectasis of the left lower lung. Electronically Signed   By: Aletta Edouard M.D.   On: 07/06/2019 08:57   DG CHEST PORT 1 VIEW  Result Date: 07/07/2019 CLINICAL DATA:  Cardio respiratory status. EXAM: PORTABLE CHEST 1 VIEW COMPARISON:  07/06/2019 FINDINGS: Right IJ central venous catheter unchanged with tip over the cavoatrial junction. Two left-sided chest tubes unchanged. Suggestion of a tiny left apical pneumothorax. Surgical suture line over the left upper lung. Persistent hazy opacification over  the left mid to lower lung with slight improved aeration in the left base. Right lung is clear. Cardiomediastinal silhouette and remainder of the exam is unchanged. IMPRESSION: 1. Persistent hazy density over the left mid to lower lung with slight improved aeration in the left base. Suggestion of a tiny left apical pneumothorax. 2.  Tubes and lines as described. Electronically Signed   By: Elberta Fortis M.D.   On: 07/07/2019 08:41    Assessment/Plan: S/P Procedure(s) (LRB): Left VATS, thoracotomy,  drainage of pleural effusion, removal of foreign body (Left) possible THORACOTOMY (Left)  1 doing well 2 sinus tachy mostly reactive- tol well , could consider beta blocker if becomes symptomatic 3 not much PCA use- d/c, cont po analgesics 4 d/c central line, has peripheral 5 smaller air leak, tiny pntx- will d/c one chest tube today, cont 10 cm H2O suction 6 normal renal fxn 7 H/H improved 8 no leukocytosis 9 mild reactive thrombocytosis 10 medical management per hospitalists    LOS: 5 days    Rowe Clack PA-C 07/07/2019
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
## 2019-07-08 ENCOUNTER — Encounter: Payer: Self-pay | Admitting: *Deleted

## 2019-07-08 ENCOUNTER — Inpatient Hospital Stay (HOSPITAL_COMMUNITY): Payer: Self-pay

## 2019-07-08 LAB — AEROBIC/ANAEROBIC CULTURE W GRAM STAIN (SURGICAL/DEEP WOUND): Culture: NO GROWTH

## 2019-07-08 NOTE — Plan of Care (Signed)
  Problem: Education: Goal: Knowledge of General Education information will improve Description: Including pain rating scale, medication(s)/side effects and non-pharmacologic comfort measures Outcome: Progressing   Problem: Clinical Measurements: Goal: Will remain free from infection Outcome: Progressing   Problem: Clinical Measurements: Goal: Respiratory complications will improve Outcome: Progressing   Problem: Clinical Measurements: Goal: Cardiovascular complication will be avoided Outcome: Progressing   Problem: Activity: Goal: Risk for activity intolerance will decrease Outcome: Progressing   Problem: Nutrition: Goal: Adequate nutrition will be maintained Outcome: Progressing   Problem: Elimination: Goal: Will not experience complications related to bowel motility Outcome: Progressing
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
## 2019-07-08 NOTE — Progress Notes (Signed)
5 Days Post-Op Procedure(s) (LRB): Left VATS, thoracotomy, drainage of pleural effusion, removal of foreign body (Left) possible THORACOTOMY (Left) Subjective: Feeling better, pain controlled. No shortness of breath.   Objective: Vital signs in last 24 hours: Temp:  [97.6 F (36.4 C)-98.5 F (36.9 C)] 98.2 F (36.8 C) (12/28 0744) Pulse Rate:  [87-107] 89 (12/28 0744) Cardiac Rhythm: Normal sinus rhythm (12/28 0700) Resp:  [18-24] 19 (12/28 0744) BP: (90-109)/(62-75) 90/62 (12/28 0744) SpO2:  [98 %-100 %] 98 % (12/28 0744)     Intake/Output from previous day: 12/27 0701 - 12/28 0700 In: 723.5 [P.O.:250; I.V.:473.5] Out: -  Intake/Output this shift: No intake/output data recorded.  Physical Exam: General appearance: no distress Neurologic: intact Heart: regular rhythm, sinus tach. Lungs: Breath sounds clear. No air leak, CT drainage not documented past 24 hours. Wound: Clean dry dressing over the left thoracotomy incision.  Lab Results: Recent Labs    07/07/19 0423  WBC 9.9  HGB 10.8*  HCT 31.7*  PLT 444*   BMET:  Recent Labs    07/07/19 0423  NA 135  K 3.8  CL 103  CO2 26  GLUCOSE 88  BUN 9  CREATININE 0.59  CALCIUM 8.5*    PT/INR: No results for input(s): LABPROT, INR in the last 72 hours. ABG    Component Value Date/Time   PHART 7.440 07/04/2019 0142   HCO3 28.3 (H) 07/04/2019 0142   O2SAT 98.2 07/04/2019 0142   CBG (last 3)  No results for input(s): GLUCAP in the last 72 hours.  PORTABLE CHEST 1 VIEW  COMPARISON:  Chest x-ray 07/07/2019 and prior chest CT 07/02/2019  FINDINGS: The more medial chest tube is been removed. The lateral chest tube is still in place. No definite pneumothorax.  Stable surgical changes involving the left lung.  Stable left-sided subcutaneous emphysema.  The right IJ central venous catheter has been removed.  Persistent left lower lobe interstitial and airspace process, likely combination of  atelectasis, edema and pulmonary hemorrhage. The right lung remains clear.  IMPRESSION: 1. Removal of the more medial left-sided chest tube. No definite pneumothorax. 2. Persistent interstitial and airspace process in the left lung. The right lung remains clear.   Electronically Signed   By: Marijo Sanes M.D.   On: 07/08/2019 07:34  Assessment/Plan: S/P Procedure(s) (LRB): Left VATS, thoracotomy, drainage of pleural effusion, removal of foreign body (Left) possible THORACOTOMY (Left)  -POD4 left VATS / thoracotomy for removal of retained FB. Respiratory status stable, pain controlled. No air leak. Medial CT removed yesterday, remaining CT to suction. Very mild subQ air appears to be dissipating. WBC has normalized and operative cultures are negative at 4 days. Possibly remove remaining tube today. Continue to encourage pulmonary hygiene.  -Expected acute blood loss anemia-transfused POD-1, Hct stable.  -DVT PPX- to start  Lovenox this evening.    LOS: 6 days    Antony Odea, PA-C [phone] 07/08/2019

## 2019-07-09 ENCOUNTER — Inpatient Hospital Stay (HOSPITAL_COMMUNITY): Payer: Self-pay

## 2019-07-09 LAB — SURGICAL PATHOLOGY
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
## 2019-07-09 NOTE — Plan of Care (Signed)

## 2019-07-09 NOTE — Progress Notes (Addendum)
      [id number]       [place] [address]             [phone]      6 Days Post-Op Procedure(s) (LRB): Left VATS, thoracotomy, drainage of pleural effusion, removal of foreign body (Left) THORACOTOMY (Left) Subjective: Resting in bed, denies pain or shortness of breath.   Objective: Vital signs in last 24 hours: Temp:  [97.8 F (36.6 C)-98.4 F (36.9 C)] 97.9 F (36.6 C) (12/29 0336) Pulse Rate:  [81-88] 81 (12/29 0336) Cardiac Rhythm: Normal sinus rhythm (12/29 0700) Resp:  [17-19] 19 (12/29 0336) BP: (91-97)/(61-78) 97/61 (12/29 0336) SpO2:  [98 %-99 %] 98 % (12/29 0336)     Intake/Output from previous day: 12/28 0701 - 12/29 0700 In: 360 [P.O.:360] Out: -  Intake/Output this shift: No intake/output data recorded.  Physical Exam: General appearance:no distress Neurologic:intact Heart:regular rhythm, sinus tach. Lungs:Breath sounds clear. O2 sat 98% on RA. CT removed yesterday. CXR this AM shows 35mm left apical PTX.  Left lower lobe density / ATX without significant change.  Wound:the left thoracotomy incision is clean and dry, open to air.   Lab Results: Recent Labs    07/07/19 0423  WBC 9.9  HGB 10.8*  HCT 31.7*  PLT 444*   BMET:  Recent Labs    07/07/19 0423  NA 135  K 3.8  CL 103  CO2 26  GLUCOSE 88  BUN 9  CREATININE 0.59  CALCIUM 8.5*    PT/INR: No results for input(s): LABPROT, INR in the last 72 hours. ABG    Component Value Date/Time   PHART 7.440 07/04/2019 0142   HCO3 28.3 (H) 07/04/2019 0142   O2SAT 98.2 07/04/2019 0142   CBG (last 3)  No results for input(s): GLUCAP in the last 72 hours.  Assessment/Plan: S/P Procedure(s) (LRB): Left VATS, thoracotomy, drainage of pleural effusion, removal of foreign body (Left) THORACOTOMY (Left)  -POD5 left VATS / thoracotomy for removal of retained FB. Respiratory status stable, pain controlled. CT removed yesterday. CXR this AM shows 44mm left apical PTX.  This  will likely resolve without intervention but should be observed another 24 hours to assure it is not expanding.  Continue to encourage pulmonary hygiene.  -Expected acute blood loss anemia-transfused POD-1, Hct stable.  -DVT PPX- continuetSC Lovenox  Antony Odea, PA-C [phone] 07/09/2019 Patient seen and examined, agree with above Repeat CXR tomorrow- home if [place]. Roxan Hockey, MD [facility] [phone]

## 2019-07-10 ENCOUNTER — Inpatient Hospital Stay (HOSPITAL_COMMUNITY): Payer: Self-pay
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
## 2019-07-10 NOTE — Plan of Care (Signed)
  Problem: Education: Goal: Knowledge of General Education information will improve Description: Including pain rating scale, medication(s)/side effects and non-pharmacologic comfort measures Outcome: Completed/Met   Problem: Health Behavior/Discharge Planning: Goal: Ability to manage health-related needs will improve Outcome: Completed/Met   Problem: Clinical Measurements: Goal: Ability to maintain clinical measurements within normal limits will improve Outcome: Completed/Met Goal: Will remain free from infection Outcome: Completed/Met Goal: Diagnostic test results will improve Outcome: Completed/Met Goal: Respiratory complications will improve Outcome: Completed/Met Goal: Cardiovascular complication will be avoided Outcome: Completed/Met   Problem: Activity: Goal: Risk for activity intolerance will decrease Outcome: Completed/Met   Problem: Nutrition: Goal: Adequate nutrition will be maintained Outcome: Completed/Met   Problem: Coping: Goal: Level of anxiety will decrease Outcome: Completed/Met   Problem: Elimination: Goal: Will not experience complications related to bowel motility Outcome: Completed/Met Goal: Will not experience complications related to urinary retention Outcome: Completed/Met   Problem: Pain Managment: Goal: General experience of comfort will improve Outcome: Completed/Met   Problem: Safety: Goal: Ability to remain free from injury will improve Outcome: Completed/Met   Problem: Skin Integrity: Goal: Risk for impaired skin integrity will decrease Outcome: Completed/Met   Problem: Education: Goal: Required Educational Video(s) Outcome: Completed/Met   Problem: Clinical Measurements: Goal: Postoperative complications will be avoided or minimized Outcome: Completed/Met   Problem: Skin Integrity: Goal: Demonstration of wound healing without infection will improve Outcome: Completed/Met

## 2019-07-10 NOTE — Progress Notes (Signed)
7 Days Post-Op Procedure(s) (LRB): Left VATS, thoracotomy, drainage of pleural effusion, removal of foreign body (Left) THORACOTOMY (Left) Subjective: Feels good, no complaints. No shortness of breath. Pain is controlled.   Objective: Vital signs in last 24 hours: Temp:  [97.5 F (36.4 C)-98.5 F (36.9 C)] 97.5 F (36.4 C) (12/30 0726) Pulse Rate:  [77-91] 87 (12/30 0726) Cardiac Rhythm: Normal sinus rhythm (12/30 0700) Resp:  [12-30] 20 (12/30 0726) BP: (99-117)/(55-78) 99/63 (12/30 0726) SpO2:  [100 %] 100 % (12/30 0726)     Intake/Output from previous day: No intake/output data recorded. Intake/Output this shift: No intake/output data recorded.  Physical Exam: General appearance:no distress Neurologic:intact Heart:regular rhythm, sinus tach. Lungs:Breath sounds clear. O2 sat 98% on RA. CXR this AM shows improvement in small apical and lateral PTX.  Left lower lobe density / ATX without significant change.  Wound:the left thoracotomy incision is clean and dry, open to air. CT insertion sites well approximated with silk sutures and are dry.  Lab Results: No results for input(s): WBC, HGB, HCT, PLT in the last 72 hours. BMET: No results for input(s): NA, K, CL, CO2, GLUCOSE, BUN, CREATININE, CALCIUM in the last 72 hours.  PT/INR: No results for input(s): LABPROT, INR in the last 72 hours. ABG    Component Value Date/Time   PHART 7.440 07/04/2019 0142   HCO3 28.3 (H) 07/04/2019 0142   O2SAT 98.2 07/04/2019 0142   CBG (last 3)  No results for input(s): GLUCAP in the last 72 hours.  Assessment/Plan: S/P Procedure(s) (LRB): Left VATS, thoracotomy, drainage of pleural effusion, removal of foreign body (Left) THORACOTOMY (Left)  -POD5left VATS / thoracotomy for removal of retained FB. Respiratory status stable, pain controlled. CT removed 48 hours ago.  CXR yesterday showed an 82mm left apical PTX.  This has diminished over the past 24 hours. OK to discharge from CTS  standpoint. Will remove sutures in the office next week and  arrange f/u with Dr. Roxan Hockey in 2 weeks.   Continue to encourage incentive spirometry at home.   LOS: 8 days    Antony Odea, PA-C [phone] 07/10/2019

## 2019-07-10 NOTE — Progress Notes (Signed)
Discharge instructions discussed and explained to patient. Follow up appts. Set up and aware. No extra prescriptions given. Going home with mother.

## 2019-07-10 NOTE — Discharge Instructions (Signed)
Pneumothorax °A pneumothorax is commonly called a collapsed lung. It is a condition in which air leaks from a lung and builds up between the thin layer of tissue that covers the lungs (visceral pleura) and the interior wall of the chest cavity (parietal pleura). The air gets trapped outside the lung, between the lung and the chest wall (pleural space). The air takes up space and prevents the lung from fully expanding. °This condition sometimes occurs suddenly with no apparent cause. The buildup of air may be small or large. A small pneumothorax may go away on its own. A large pneumothorax will require treatment and hospitalization. °What are the causes? °This condition may be caused by: °· Trauma and injury to the chest wall. °· Surgery and other medical procedures. °· A complication of an underlying lung problem, especially chronic obstructive pulmonary disease (COPD) or emphysema. °Sometimes the cause of this condition is not known. °What increases the risk? °You are more likely to develop this condition if: °· You have an underlying lung problem. °· You smoke. °· You are 20-40 years old, female, tall, and underweight. °· You have a personal or family history of pneumothorax. °· You have an eating disorder (anorexia nervosa). °This condition can also happen quickly, even in people with no history of lung problems. °What are the signs or symptoms? °Sometimes a pneumothorax will have no symptoms. When symptoms are present, they can include: °· Chest pain. °· Shortness of breath. °· Increased rate of breathing. °· Bluish color to your lips or skin (cyanosis). °How is this diagnosed? °This condition may be diagnosed by: °· A medical history and physical exam. °· A chest X-ray, chest CT scan, or ultrasound. °How is this treated? °Treatment depends on how severe your condition is. The goal of treatment is to remove the extra air and allow your lung to expand back to its normal size. °· For a small pneumothorax: °? No  treatment may be needed. °? Extra oxygen is sometimes used to make it go away more quickly. °· For a large pneumothorax or a pneumothorax that is causing symptoms, a procedure is done to drain the air from your lungs. To do this, a health care provider may use: °? A needle with a syringe. This is used to suck air from a pleural space where no additional leakage is taking place. °? A chest tube. This is used to suck air where there is ongoing leakage into the pleural space. The chest tube may need to remain in place for several days until the air leak has healed. °· In more severe cases, surgery may be needed to repair the damage that is causing the leak. °· If you have multiple pneumothorax episodes or have an air leak that will not heal, a procedure called a pleurodesis may be done. A medicine is placed in the pleural space to irritate the tissues around the lung so that the lung will stick to the chest wall, seal any leaks, and stop any buildup of air in that space. °If you have an underlying lung problem, severe symptoms, or a large pneumothorax you will usually need to stay in the hospital. °Follow these instructions at home: °Lifestyle °· Do not use any products that contain nicotine or tobacco, such as cigarettes and e-cigarettes. These are major risk factors in pneumothorax. If you need help quitting, ask your health care provider. °· Do not lift anything that is heavier than 10 lb (4.5 kg), or the limit that your health care   provider tells you, until he or she says that it is safe. °· Avoid activities that take a lot of effort (strenuous) for as long as told by your health care provider. °· Return to your normal activities as told by your health care provider. Ask your health care provider what activities are safe for you. °· Do not fly in an airplane or scuba dive until your health care provider says it is okay. °General instructions °· Take over-the-counter and prescription medicines only as told by your  health care provider. °· If a cough or pain makes it difficult for you to sleep at night, try sleeping in a semi-upright position in a recliner or by using 2 or 3 pillows. °· If you had a chest tube and it was removed, ask your health care provider when you can remove the bandage (dressing). While the dressing is in place, do not allow it to get wet. °· Keep all follow-up visits as told by your health care provider. This is important. °Contact a health care provider if: °· You cough up thick mucus (sputum) that is yellow or green in color. °· You were treated with a chest tube, and you have redness, increasing pain, or discharge at the site where it was placed. °Get help right away if: °· You have increasing chest pain or shortness of breath. °· You have a cough that will not go away. °· You begin coughing up blood. °· You have pain that is getting worse or is not controlled with medicines. °· The site where your chest tube was located opens up. °· You feel air coming out of the site where the chest tube was placed. °· You have a fever or persistent symptoms for more than 2-3 days. °· You have a fever and your symptoms suddenly get worse. °These symptoms may represent a serious problem that is an emergency. Do not wait to see if the symptoms will go away. Get medical help right away. Call your local emergency services (911 in the [place] Do not drive yourself to the hospital. °Summary °· A pneumothorax, commonly called a collapsed lung, is a condition in which air leaks from a lung and gets trapped between the lung and the chest wall (pleural space). °· The buildup of air may be small or large. A small pneumothorax may go away on its own. A large pneumothorax will require treatment and hospitalization. °· Treatment for this condition depends on how severe the pneumothorax is. The goal of treatment is to remove the extra air and allow the lung to expand back to its normal size. °This information is not intended to  replace advice given to you by your health care provider. Make sure you discuss any questions you have with your health care provider. °Document Released: 06/27/2005 Document Revised: 06/09/2017 Document Reviewed: 06/05/2017 °Elsevier Patient Education © 2020 [employer] ° °

## 2019-07-17 ENCOUNTER — Other Ambulatory Visit: Payer: Self-pay

## 2019-07-17 ENCOUNTER — Ambulatory Visit (INDEPENDENT_AMBULATORY_CARE_PROVIDER_SITE_OTHER): Payer: Self-pay | Admitting: *Deleted

## 2019-07-17 VITALS — Temp 97.7°F

## 2019-07-17 DIAGNOSIS — Z4802 Encounter for removal of sutures: Secondary | ICD-10-CM

## 2019-07-17 DIAGNOSIS — Z09 Encounter for follow-up examination after completed treatment for conditions other than malignant neoplasm: Secondary | ICD-10-CM

## 2019-07-17 DIAGNOSIS — T81500D Unspecified complication of foreign body accidentally left in body following surgical operation, subsequent encounter: Secondary | ICD-10-CM

## 2019-07-17 DIAGNOSIS — G8918 Other acute postprocedural pain: Secondary | ICD-10-CM

## 2019-07-17 MED ORDER — TRAMADOL HCL 50 MG PO TABS: 50.0000 mg | ORAL_TABLET | Freq: Four times a day (QID) | ORAL | 0 refills | Status: DC | PRN
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
## 2019-07-22 ENCOUNTER — Other Ambulatory Visit: Payer: Self-pay | Admitting: Thoracic Surgery (Cardiothoracic Vascular Surgery)

## 2019-07-22 DIAGNOSIS — J90 Pleural effusion, not elsewhere classified: Secondary | ICD-10-CM

## 2019-07-23 ENCOUNTER — Ambulatory Visit (INDEPENDENT_AMBULATORY_CARE_PROVIDER_SITE_OTHER): Payer: Self-pay | Admitting: Thoracic Surgery (Cardiothoracic Vascular Surgery)

## 2019-07-23 ENCOUNTER — Other Ambulatory Visit: Payer: Self-pay

## 2019-07-23 ENCOUNTER — Encounter: Payer: Self-pay | Admitting: Thoracic Surgery (Cardiothoracic Vascular Surgery)

## 2019-07-23 ENCOUNTER — Ambulatory Visit
Admission: RE | Admit: 2019-07-23 | Discharge: 2019-07-23 | Disposition: A | Discharge status: Home / Self Care | Payer: Medicaid Other | Source: Ambulatory Visit | Attending: Thoracic Surgery (Cardiothoracic Vascular Surgery) | Admitting: Thoracic Surgery (Cardiothoracic Vascular Surgery)

## 2019-07-23 VITALS — BP 97/62 | HR 78 | Temp 97.7°F | Resp 16 | Ht 61.0 in | Wt 104.0 lb

## 2019-07-23 DIAGNOSIS — Z09 Encounter for follow-up examination after completed treatment for conditions other than malignant neoplasm: Secondary | ICD-10-CM

## 2019-07-23 DIAGNOSIS — J90 Pleural effusion, not elsewhere classified: Secondary | ICD-10-CM

## 2019-07-23 DIAGNOSIS — T81500D Unspecified complication of foreign body accidentally left in body following surgical operation, subsequent encounter: Secondary | ICD-10-CM

## 2019-07-23 MED ORDER — PREGABALIN 25 MG PO CAPS: 25.0000 mg | ORAL_CAPSULE | Freq: Two times a day (BID) | ORAL | 3 refills | Status: DC
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
## 2019-08-26 ENCOUNTER — Other Ambulatory Visit: Payer: Self-pay | Admitting: Thoracic Surgery (Cardiothoracic Vascular Surgery)

## 2019-08-26 DIAGNOSIS — J90 Pleural effusion, not elsewhere classified: Secondary | ICD-10-CM

## 2019-08-27 ENCOUNTER — Ambulatory Visit
Admission: RE | Admit: 2019-08-27 | Discharge: 2019-08-27 | Disposition: A | Discharge status: Home / Self Care | Payer: Medicaid Other | Source: Ambulatory Visit | Attending: Thoracic Surgery (Cardiothoracic Vascular Surgery) | Admitting: Thoracic Surgery (Cardiothoracic Vascular Surgery)

## 2019-08-27 ENCOUNTER — Ambulatory Visit (INDEPENDENT_AMBULATORY_CARE_PROVIDER_SITE_OTHER): Payer: Self-pay | Admitting: Thoracic Surgery (Cardiothoracic Vascular Surgery)

## 2019-08-27 ENCOUNTER — Other Ambulatory Visit: Payer: Self-pay

## 2019-08-27 VITALS — BP 107/71 | HR 82 | Temp 97.7°F | Resp 20 | Ht 61.0 in | Wt 111.5 lb

## 2019-08-27 DIAGNOSIS — J90 Pleural effusion, not elsewhere classified: Secondary | ICD-10-CM

## 2019-08-27 DIAGNOSIS — T81509D Unspecified complication of foreign body accidentally left in body following unspecified procedure, subsequent encounter: Secondary | ICD-10-CM
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
## 2019-10-29 ENCOUNTER — Ambulatory Visit: Payer: Medicaid Other | Admitting: Thoracic Surgery (Cardiothoracic Vascular Surgery)

## 2019-11-11 ENCOUNTER — Other Ambulatory Visit: Payer: Self-pay | Admitting: Thoracic Surgery (Cardiothoracic Vascular Surgery)

## 2019-11-11 DIAGNOSIS — J90 Pleural effusion, not elsewhere classified: Secondary | ICD-10-CM

## 2019-11-12 ENCOUNTER — Ambulatory Visit: Payer: Self-pay | Admitting: Thoracic Surgery (Cardiothoracic Vascular Surgery)

## 2019-11-26 ENCOUNTER — Ambulatory Visit
Admission: RE | Admit: 2019-11-26 | Discharge: 2019-11-26 | Disposition: A | Discharge status: Home / Self Care | Payer: Medicaid Other | Source: Ambulatory Visit | Attending: Thoracic Surgery (Cardiothoracic Vascular Surgery) | Admitting: Thoracic Surgery (Cardiothoracic Vascular Surgery)

## 2019-11-26 ENCOUNTER — Other Ambulatory Visit: Payer: Self-pay

## 2019-11-26 ENCOUNTER — Ambulatory Visit: Payer: Medicaid Other | Admitting: Thoracic Surgery (Cardiothoracic Vascular Surgery)

## 2019-11-26 VITALS — BP 108/65 | HR 93 | Temp 98.4°F | Resp 20 | Ht 61.0 in | Wt 119.0 lb

## 2019-11-26 DIAGNOSIS — J90 Pleural effusion, not elsewhere classified: Secondary | ICD-10-CM

## 2019-11-26 DIAGNOSIS — Z09 Encounter for follow-up examination after completed treatment for conditions other than malignant neoplasm: Secondary | ICD-10-CM

## 2019-11-26 DIAGNOSIS — W3400XA Accidental discharge from unspecified firearms or gun, initial encounter: Secondary | ICD-10-CM
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
## 2020-11-03 DIAGNOSIS — Z3009 Encounter for other general counseling and advice on contraception: Secondary | ICD-10-CM | POA: Diagnosis not present

## 2020-11-03 DIAGNOSIS — Z3201 Encounter for pregnancy test, result positive: Secondary | ICD-10-CM | POA: Diagnosis not present

## 2020-12-02 ENCOUNTER — Telehealth (INDEPENDENT_AMBULATORY_CARE_PROVIDER_SITE_OTHER): Payer: Medicaid Other | Admitting: Internal Medicine

## 2020-12-02 ENCOUNTER — Encounter: Payer: Self-pay | Admitting: Internal Medicine

## 2020-12-02 ENCOUNTER — Other Ambulatory Visit: Payer: Self-pay

## 2020-12-02 DIAGNOSIS — Z7689 Persons encountering health services in other specified circumstances: Secondary | ICD-10-CM | POA: Diagnosis not present

## 2020-12-02 DIAGNOSIS — Z3A09 9 weeks gestation of pregnancy: Secondary | ICD-10-CM
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
## 2020-12-16 ENCOUNTER — Inpatient Hospital Stay (HOSPITAL_COMMUNITY)
Admission: EM | Admit: 2020-12-16 | Discharge: 2020-12-16 | Disposition: A | Discharge status: Home / Self Care | Payer: Medicaid Other | Attending: Obstetrics & Gynecology | Admitting: Obstetrics & Gynecology

## 2020-12-16 ENCOUNTER — Other Ambulatory Visit: Payer: Self-pay

## 2020-12-16 ENCOUNTER — Encounter (HOSPITAL_COMMUNITY): Payer: Self-pay

## 2020-12-16 ENCOUNTER — Inpatient Hospital Stay (HOSPITAL_COMMUNITY): Payer: Medicaid Other

## 2020-12-16 DIAGNOSIS — O26891 Other specified pregnancy related conditions, first trimester: Secondary | ICD-10-CM | POA: Insufficient documentation

## 2020-12-16 DIAGNOSIS — R102 Pelvic and perineal pain: Secondary | ICD-10-CM | POA: Diagnosis not present

## 2020-12-16 DIAGNOSIS — F1290 Cannabis use, unspecified, uncomplicated: Secondary | ICD-10-CM | POA: Insufficient documentation

## 2020-12-16 DIAGNOSIS — O208 Other hemorrhage in early pregnancy: Secondary | ICD-10-CM | POA: Diagnosis not present

## 2020-12-16 DIAGNOSIS — O99321 Drug use complicating pregnancy, first trimester: Secondary | ICD-10-CM | POA: Insufficient documentation

## 2020-12-16 DIAGNOSIS — Z3A00 Weeks of gestation of pregnancy not specified: Secondary | ICD-10-CM | POA: Diagnosis not present

## 2020-12-16 DIAGNOSIS — O034 Incomplete spontaneous abortion without complication: Secondary | ICD-10-CM | POA: Diagnosis not present

## 2020-12-16 DIAGNOSIS — Z3A11 11 weeks gestation of pregnancy: Secondary | ICD-10-CM | POA: Insufficient documentation

## 2020-12-16 DIAGNOSIS — Z3201 Encounter for pregnancy test, result positive: Secondary | ICD-10-CM | POA: Diagnosis not present

## 2020-12-16 DIAGNOSIS — O3680X0 Pregnancy with inconclusive fetal viability, not applicable or unspecified: Secondary | ICD-10-CM

## 2020-12-16 LAB — PREGNANCY, URINE: Preg Test, Ur: POSITIVE — AB

## 2020-12-16 LAB — URINALYSIS, ROUTINE W REFLEX MICROSCOPIC
Bacteria, UA: NONE SEEN
Bilirubin Urine: NEGATIVE
Glucose, UA: NEGATIVE mg/dL
Ketones, ur: 20 mg/dL — AB
Leukocytes,Ua: NEGATIVE
Nitrite: NEGATIVE
Protein, ur: NEGATIVE mg/dL
RBC / HPF: >50 RBC/hpf — ABNORMAL HIGH (ref 0–5)
Specific Gravity, Urine: 1.023 (ref 1.005–1.030)
pH: 6 (ref 5.0–8.0)

## 2020-12-16 LAB — CBC
HCT: 38.1 % (ref 36.0–46.0)
Hemoglobin: 12.7 g/dL (ref 12.0–15.0)
MCH: 29.5 pg (ref 26.0–34.0)
MCHC: 33.3 g/dL (ref 30.0–36.0)
MCV: 88.4 fL (ref 80.0–100.0)
Platelets: 409 10*3/uL — ABNORMAL HIGH (ref 150–400)
RBC: 4.31 MIL/uL (ref 3.87–5.11)
RDW: 13.9 % (ref 11.5–15.5)
WBC: 9.8 10*3/uL (ref 4.0–10.5)
nRBC: 0 % (ref 0.0–0.2)

## 2020-12-16 LAB — WET PREP, GENITAL
Clue Cells Wet Prep HPF POC: NONE SEEN
Sperm: NONE SEEN
Trich, Wet Prep: NONE SEEN
Yeast Wet Prep HPF POC: NONE SEEN

## 2020-12-16 LAB — HIV ANTIBODY (ROUTINE TESTING W REFLEX): HIV Screen 4th Generation wRfx: NONREACTIVE

## 2020-12-16 LAB — HCG, QUANTITATIVE, PREGNANCY: hCG, Beta Chain, Quant, S: 4337 m[IU]/mL — ABNORMAL HIGH (ref <5)
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
## 2020-12-16 NOTE — ED Notes (Signed)
Report called to MAU  

## 2020-12-16 NOTE — MAU Note (Signed)
Pt reports she began having some bleeding after she had intercourse 2 days ago, has been off/on since then until today when it got heavier and she started passing clots. Also began having pain today in her lower abd.

## 2020-12-16 NOTE — Discharge Instructions (Signed)
Incomplete Miscarriage An incomplete miscarriage happens when tissue from pregnancy or part of the placenta, known as products of conception, remain in the body after a miscarriage. A miscarriage is the loss of pregnancy before the 20th week. Most miscarriages happen in the first 3 months of pregnancy. Sometimes, a miscarriage happens before a woman knows she is pregnant. Having a miscarriage can be an emotional experience. If you have had a miscarriage, talk with your health care provider about any questions you may have about:  The loss of your baby.  The grieving process.  Your future pregnancy plans. What are the causes? Many times, the cause of an incomplete miscarriage is not known. What increases the risk? The following factors may make a pregnant woman more likely to have an incomplete miscarriage:  Using a watch-and-wait approach (expectant management) to treat a miscarriage.  Using medicine to treat a miscarriage. What are the signs or symptoms? Symptoms of this condition include:  Vaginal bleeding or spotting, with or without cramps or pain.  Pain or cramping in the abdomen or lower back.  Fluid or tissue coming out of the vagina. How is this diagnosed? This condition may be diagnosed based on:  A physical exam.  Ultrasound. How is this treated? An incomplete miscarriage may be treated with:  Dilation and curettage (D&C). In this procedure, the cervix is stretched open and any remaining pregnancy tissue is removed from the lining of the uterus (endometrium). The cervix is the lowest part of the uterus, which opens into the vagina.  Medicines. These may include: ? Antibiotic medicine, to treat infection. ? Medicine to help any remaining pregnancy tissue come out of the uterus. ? Medicine to reduce (contract) the size of the uterus. These medicines may be given if there is a lot of bleeding. If you have Rh-negative blood, you may be given an injection of Rho(D) immune  globulin to help prevent problems with future pregnancies. Follow these instructions at home: Medicines  Take over-the-counter and prescription medicines only as told by your health care provider.  If you were prescribed antibiotic medicine, take your antibiotic as told by your health care provider. Do not stop taking the antibiotic even if you start to feel better. Activity  Rest as told by your health care provider. Ask your health care provider what activities are safe for you.  Have someone help with home and family responsibilities during this time. General instructions  Monitor how much tissue or blood comes out of the vagina.  Do not have sex, douche, or put anything in your vagina, such as tampons, until your health care provider says it is okay.  To help you and your partner with the grieving process, talk with your health care provider or get counseling to help deal with the pregnancy loss.  When you are ready, meet with your health care provider to discuss any important steps you should take for your health. Also, discuss steps you should take to have a healthy pregnancy in the future.  Keep all follow-up visits. This is important.   Where to find more information  The American College of Obstetricians and Gynecologists: acog.org  [place] Department of Health and Human Services Office of Women's Health: hrsa.gov/office-womens-health Contact a health care provider if:  You have a fever or chills.  There is bad-smelling fluid coming from your vagina.  You have more bleeding instead of less.  Tissue or blood clots come out of your vagina. Get help right away if:  You   have severe cramps or pain in your back or abdomen.  Heavy bleeding soaks through 2 large sanitary pads an hour for more than 2 hours.  You become light-headed or weak.  You faint.  You feel sad, and your sadness takes over your thoughts.  You think about hurting yourself. If you ever feel like you  may hurt yourself or others, or have thoughts about taking your own life, get help right away. Go to your nearest emergency department or:  Call your local emergency services (911 in the [place]  Call a suicide crisis helpline, such as the National Suicide Prevention Lifeline at 1-800-273-8255. This is open 24 hours a day in the [place]  Text the Crisis Text Line at 741741 (in the [place] Summary  An incomplete miscarriage happens when tissue from pregnancy or part of the placenta, known as products of conception, remain in the body after a miscarriage.  Treatment may include a dilation and curettage (D&C) procedure or medicines. In a D&C procedure, tissue is removed from the uterus.  Rest as told by your health care provider. Ask your health care provider what activities are safe for you.  To help you and your partner with the grieving process, talk with your health care provider or get counseling to help deal with the pregnancy loss. This information is not intended to replace advice given to you by your health care provider. Make sure you discuss any questions you have with your health care provider. Document Revised: 12/27/2019 Document Reviewed: 12/27/2019 Elsevier Patient Education  2021 Elsevier Inc.  

## 2020-12-16 NOTE — ED Triage Notes (Signed)
Pt states that she is [redacted] weeks pregnant and began to have vaginal bleeding 3 days ago with vaginal pain.

## 2020-12-16 NOTE — ED Provider Notes (Signed)
Emergency Medicine Provider Triage Evaluation Note  Cassandra Bowen , a 30 y.o. female  was evaluated in triage.  Pt complains of vaginal bleeding. She is currently pregnant and estimates she is about [redacted] weeks along by lmp.  Review of Systems  Positive: Vaginal bleeding Negative: fever  Physical Exam  BP 105/68 (BP Location: Left Arm)   Pulse 74   Temp 99.2 F (37.3 C) (Oral)   Resp 14   SpO2 98%  Gen:   Awake, no distress   Resp:  Normal effort  MSK:   Moves extremities without difficulty  Other:  No abd ttp  Medical Decision Making  Medically screening exam initiated at 8:43 PM.  Appropriate orders placed.  Cassandra Bowen was informed that the remainder of the evaluation will be completed by another provider, this initial triage assessment does not replace that evaluation, and the importance of remaining in the ED until their evaluation is complete.  8:39 PM Cassandra Bowen, CMN at MAU who accepts patient for transfer   Cassandra Bowen 12/16/20 2043    Pricilla Loveless, MD 12/16/20 [phone]

## 2020-12-17 DIAGNOSIS — O034 Incomplete spontaneous abortion without complication: Secondary | ICD-10-CM | POA: Diagnosis present

## 2020-12-18 LAB — GC/CHLAMYDIA PROBE AMP (~~LOC~~) NOT AT ARMC
Chlamydia: NEGATIVE
Comment: NEGATIVE
Comment: NORMAL
Neisseria Gonorrhea: NEGATIVE
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
## 2021-01-16 IMAGING — DX DG CHEST 1V PORT
1 series · 1 of 1 positions shown · non-contrast
Comparison: 07/03/2019

CLINICAL DATA: Chest tube, shortness of breath, chest pain

EXAM:
PORTABLE CHEST 1 VIEW

[chest ap]
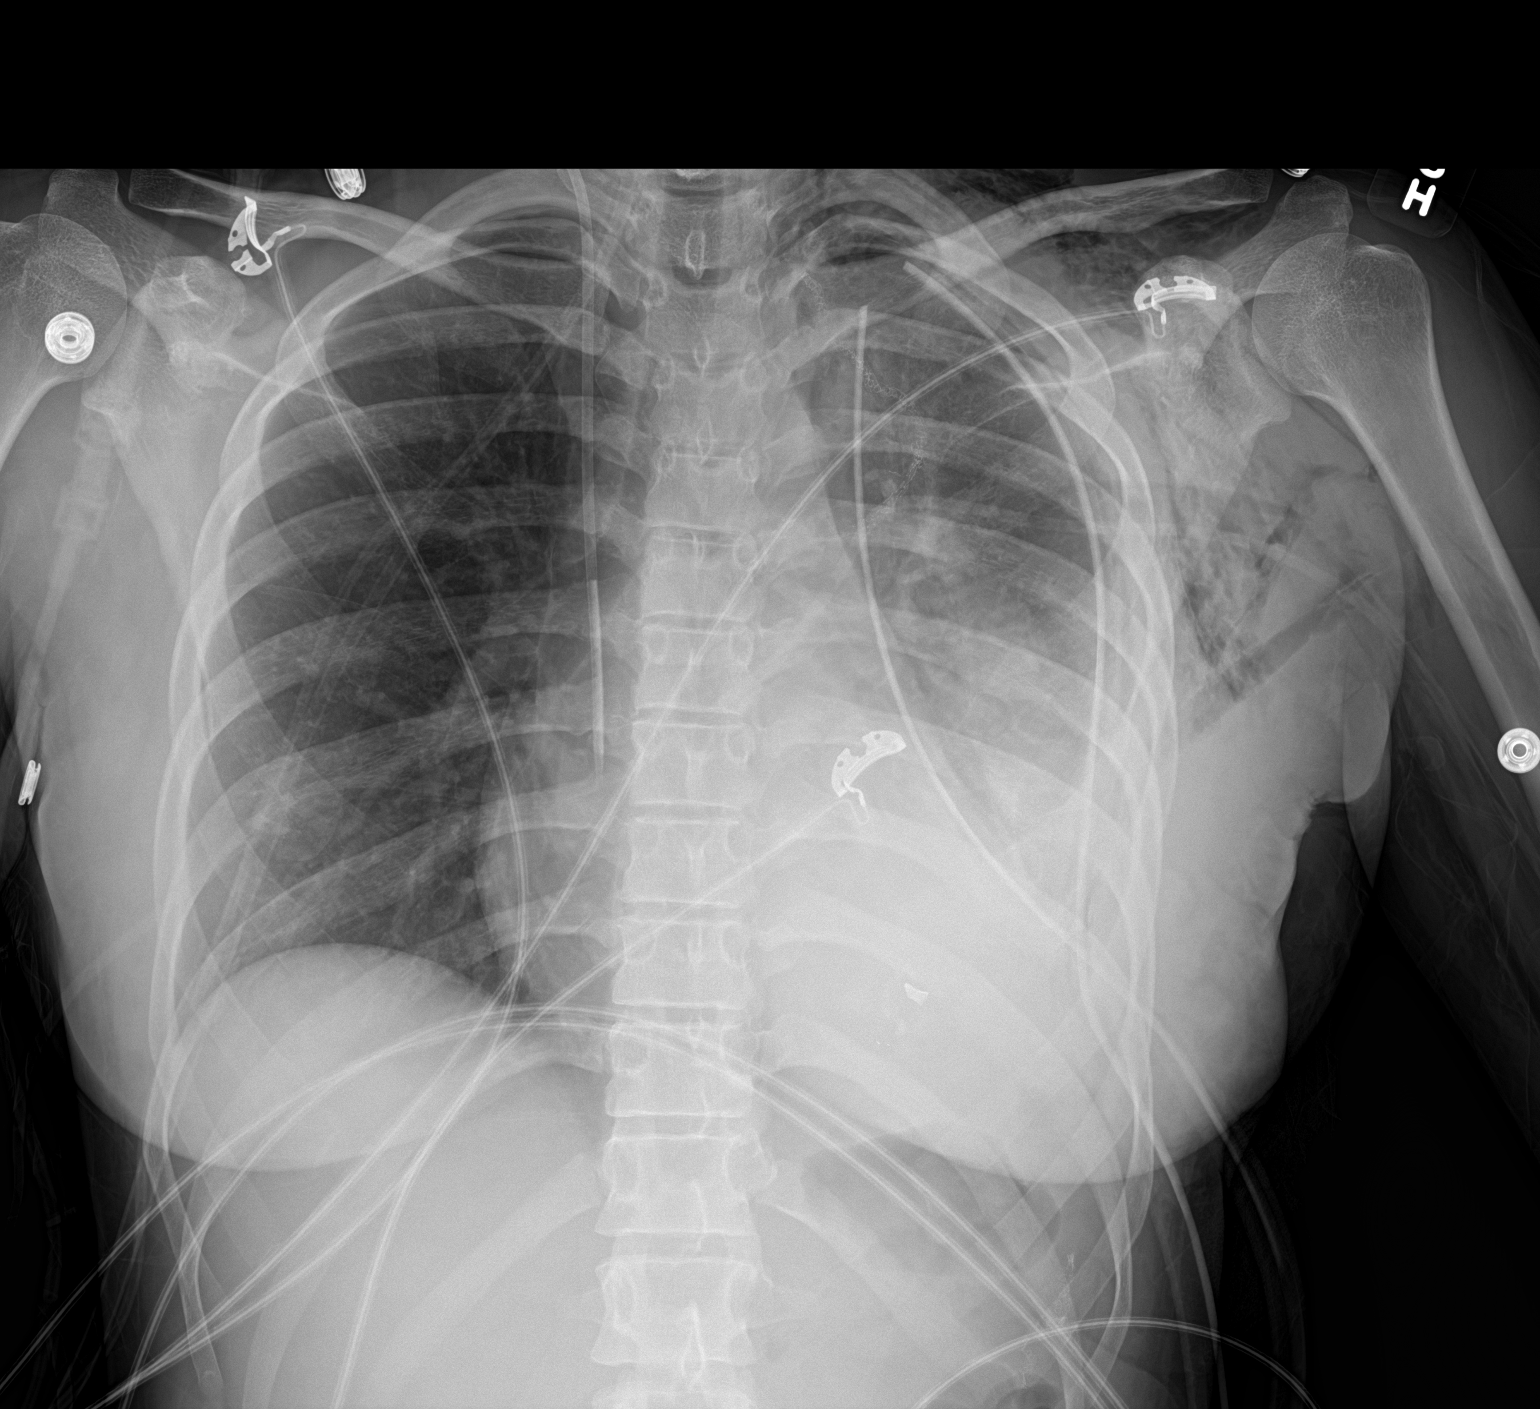

[1 of 1 positions shown; findings below may reference images not displayed]

FINDINGS: Interval extubation. Left chest tubes remain in place. New
subcutaneous emphysema throughout the left chest wall. Tiny left
apical pneumothorax. Postoperative changes in the left lung with
airspace disease diffusely throughout the left lung, increasing
since prior study. Right lung clear.

Right central line tip is at the cavoatrial junction.
IMPRESSION: Interval extubation. Left chest tubes remain in place with worsening
airspace disease throughout the left lung and small left apical
pneumothorax. New subcutaneous emphysema throughout the left chest
wall and neck.
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
## 2021-04-06 ENCOUNTER — Encounter (HOSPITAL_COMMUNITY): Payer: Self-pay | Admitting: Obstetrics & Gynecology

## 2021-04-06 ENCOUNTER — Ambulatory Visit (INDEPENDENT_AMBULATORY_CARE_PROVIDER_SITE_OTHER): Payer: Medicaid Other

## 2021-04-06 ENCOUNTER — Other Ambulatory Visit: Payer: Self-pay

## 2021-04-06 ENCOUNTER — Inpatient Hospital Stay (HOSPITAL_COMMUNITY)
Admission: AD | Admit: 2021-04-06 | Discharge: 2021-04-06 | Disposition: A | Discharge status: Home / Self Care | Payer: Medicaid Other | Attending: Obstetrics & Gynecology | Admitting: Obstetrics & Gynecology

## 2021-04-06 VITALS — Ht 61.5 in | Wt 104.9 lb

## 2021-04-06 DIAGNOSIS — Z3200 Encounter for pregnancy test, result unknown: Secondary | ICD-10-CM | POA: Diagnosis not present

## 2021-04-06 DIAGNOSIS — Z3201 Encounter for pregnancy test, result positive: Secondary | ICD-10-CM

## 2021-04-06 LAB — POCT PREGNANCY, URINE: Preg Test, Ur: POSITIVE — AB

## 2021-04-06 NOTE — Progress Notes (Signed)
I agree with plan of care as documented by RN. ° °Annia Gomm, MD °

## 2021-04-06 NOTE — Progress Notes (Signed)
Pt dropped off urine today for UPT. UPT is positive.    Pt denies any vaginal bleeding, abd pain or cramps at this time. Pt advised to start taking PNV and to call OB provider for next OB appt. Pt verbalized understanding and agreeable to plan of care.   LMP: 02/10/2021 EDC: 11/17/2021 [redacted]w[redacted]d   Emery Dupuy, RN

## 2021-04-06 NOTE — MAU Provider Note (Signed)
Event Date/Time  First Provider Initiated Contact with Patient 04/06/21 0935      S Ms. Belle Charlie is a 30 y.o. G1P0010 patient who presents to MAU today requesting pregnancy confirmation. She denies all pregnancy-related complaints including abdominal pain, vaginal bleeding, dysuria, fever.   O BP 105/65 (BP Location: Right Arm)   Pulse 78   Temp 98.1 F (36.7 C) (Oral)   Resp 19   Ht 5' 1.5" (1.562 m)   Wt 47.4 kg   LMP 02/10/2021   SpO2 97%   Breastfeeding Unknown   BMI 19.41 kg/m    Physical Exam Vitals and nursing note reviewed. Exam conducted with a chaperone present.  Constitutional:      General: She is not in acute distress.    Appearance: Normal appearance. She is not ill-appearing.  Cardiovascular:     Rate and Rhythm: Normal rate.  Pulmonary:     Effort: Pulmonary effort is normal.  Neurological:     Mental Status: She is alert and oriented to person, place, and time.  Psychiatric:        Mood and Affect: Mood normal.        Behavior: Behavior normal.        Thought Content: Thought content normal.        Judgment: Judgment normal.    A Medical screening exam complete No on complaints, requesting confirmation Given contact information for [facility] to achieve confirmation via UPT  P Discharge from MAU in stable condition (discharged by CNM, elected to not stay to sign paperwork) Patient may return to MAU as needed   Clayton Bibles, CNM 04/06/2021 9:46 AM

## 2021-04-06 NOTE — Patient Instructions (Signed)
Prenatal Care Providers   [facility]  [address] [phone]

## 2021-04-06 NOTE — MAU Note (Signed)
Presents for pregnancy confirmation.  LMP 02/10/2021.  Denies VB or pain.

## 2021-05-04 ENCOUNTER — Telehealth: Payer: Self-pay

## 2021-05-04 ENCOUNTER — Telehealth (INDEPENDENT_AMBULATORY_CARE_PROVIDER_SITE_OTHER): Payer: Medicaid Other

## 2021-05-04 DIAGNOSIS — Z3480 Encounter for supervision of other normal pregnancy, unspecified trimester: Secondary | ICD-10-CM | POA: Insufficient documentation

## 2021-05-04 DIAGNOSIS — Z3A00 Weeks of gestation of pregnancy not specified: Secondary | ICD-10-CM

## 2021-05-04 DIAGNOSIS — Z136 Encounter for screening for cardiovascular disorders: Secondary | ICD-10-CM

## 2021-05-04 MED ORDER — BLOOD PRESSURE MONITORING DEVI: 1.0000 | 0 refills | Status: DC
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
## 2021-05-04 NOTE — Patient Instructions (Addendum)
AREA PEDIATRIC/FAMILY PRACTICE PHYSICIANS  Central/[place]outheast [place] ([address]) [facility] Chambliss, MD; Eniola, MD; Hale, MD; Hensel, MD; McDiarmid, MD; McIntyer, MD; Cherene Dobbins, MD; Walden, MD [address] [place], [place] [address] [phone] Mon-Fri 8:30-12:30, 1:30-5:00 Providers come to see babies at [facility] Accepting Medicaid [facility] providers who accept newborns: Koirala, MD; Morrow, MD; Wolters, MD [address], [place], [place] [address] [phone] Mon-Fri 8:00-5:30 Babies seen by providers at [facility] Does NOT accept Medicaid Please call early in hospitalization for appointment (limited availability)  [facility] Mulberry, MD [address] [place], [place] [address] [phone] Mon, Tue, Thur, Fri 8:30-5:00, Wed 10:00-7:00 (closed 1-2pm) Babies seen by [facility] providers Accepting Medicaid Rubin - Pediatrician Rubin, MD [address], [place], [place] [address] [phone] Mon-Fri 8:30-5:00, Sat 8:30-12:00 Provider comes to see babies at [facility] Accepting Medicaid Must have been referred from current patients or contacted office prior to delivery [facility] ([facility]) Brown, MD; Chandler, MD; Ettefagh, MD; Grant, MD; Lester, MD; McCormick, MD; McQueen, MD; Prose, MD; Simha, MD; Stanley, MD; Stryffeler, NP; Tebben, NP [address], [place], [place] [address] [phone] Mon, Tue, Thur, Fri 8:30-5:30, Wed 9:30-5:30, Sat 8:30-12:30 Babies seen by [facility] providers Accepting Medicaid Only accepting infants of first-time parents or siblings of current patients Hospital discharge coordinator will make follow-up appointment Jack Amos [address], [place], [place]  [address] [phone]   Fax - [phone] [facility] [address], [place], [place]  [address] Phone - [phone]   Fax - [phone] Shilpa Gosrani [address], [place], [place]  [address] [phone]  [place]heast [place] ([address]) [facility] Bates, MD; Brassfield, MD; Cooper, Cox, MD; MD; Davis, MD; Dovico, MD; Ettefaugh, MD; Little, MD; Lowe, MD; Keiffer, MD; Melvin, MD; Sumner, MD; Williams, MD [address], [place], [place] [address] [phone] Mon-Fri 8:30-5:00 (extended evenings Mon-Thur as needed), Sat-Sun 10:00-1:00 Providers come to see babies at [facility] Accepting Medicaid for families of first-time babies and families with all children in the household age 3 and under. Must register with office prior to making appointment (M-F only). [facility] Henson, NP; Knapp, MD; Lalonde, MD; Tysinger, PA [address] [place], [place] [address] [phone] Mon-Fri 8:00-5:00 Babies seen by providers at [facility] Does NOT accept Medicaid/Commercial Insurance Only [facility] ([facility])  Artis, MD; Barnes, MD; Bratton, MD; Coccaro, MD; Lockett Gardner, MD; Kramer, MD; Marshall, MD; Netherton, MD; Poleto, MD; Skinner, MD [address] [place], [place] [address] [phone] Mon-Fri 8:30-5:30, Sat (Oct.-Mar.) 9:00-1:00 Babies seen by providers at [facility] Accepting Medicaid  [place] ([address]) [facility] Reid, MD; Warner, MD [address], [place], [place] [address] [phone] Mon-Fri 8:30-5:00, Sat 8:30-12:00 Providers come to see babies at [facility] Does NOT accept Medicaid [facility] Becker, PA; Hagler, MD; Scifres, PA; Sun, MD; Swayne, MD [address], , [place] [address] [phone] Mon-Fri 8:00-5:00 Babies seen by providers at [facility] Does NOT accept Medicaid Only accepting babies of parents who  are patients Please call early in hospitalization for appointment (limited availability)  Pediatricians Clark, MD; Frye, MD; Kelleher, MD; Mack, NP; Miller, MD; O'Keller, MD; Patterson, NP; Pudlo, MD; Puzio, MD; Thomas, MD; Tucker, MD; Twiselton, MD [address], [place], [place] [address] [phone] Mon-Fri 8:00-5:00, Sat 9:00-12:00 Providers come to see babies at [facility] Does NOT accept Medicaid  [place] ([address]) [facility] providers accepting new patients: Brake, NP; Wharton, PA [address], [place], [place] [address] [phone] Mon-Fri 8:00-5:00 Babies seen by providers at [facility] Does NOT accept Medicaid Only accepting babies of parents who are patients Please call early in hospitalization for appointment (limited availability) [facility] Gay, MD; Quinlan, MD [address] [place], [place] [address] [phone] (press 1 to schedule appointment) Mon-Fri 8:00-5:00 Providers come to see babies at [facility] Does NOT accept Medicaid [facility] Mazer, MD [address] [place], [place] [address] [phone] Mon-Fri 8:30-5:00 (lunch 12:30-1:00), extended hours by appointment only Wed 5:00-6:30 Babies seen by [facility] providers Accepting Medicaid [facility] Banks, MD; Jordan, MD; Koberlein, MD [address], [place], [place] [address] [phone] Mon-Fri 8:00-5:00 Babies seen by [facility] providers Does NOT accept Medicaid [facility] Parker, MD; Hunter, MD; Wallace, DO [address] [place], [place] [address] [phone] Mon-Fri 8:00-5:00 Babies seen by [facility] providers Does NOT accept Medicaid [facility] Brandon, PA; Brecken, PA; Christy, NP; Dees, MD; DeClaire, MD; DeWeese, MD; Hansen, NP; Mills, NP; Parrish, NP; Smoot, NP; Summer, MD; Vapne,  MD [address] [place], [place] [address] [phone] Mon-Fri 8:30-5:00, Sat 10:00-1:00 Providers come to see babies at [facility] Does NOT accept Medicaid Free prenatal information session Tuesdays at 4:45pm [facility] Bouska, MD; Gordon, PA; Jeffery, PA; Weber, PA [address] [place] [address] [phone] Mon-Fri 7:30-5:30 Babies seen by [facility] providers [facility] [address], [place], [place]  [address] [phone]   Fax - [phone]  [place] ([address] & [address]) [facility] [place] [address] [place], [place] [address] [phone] Mon-Thur 8:00-6:00 Providers come to see babies at [facility] Accepting Medicaid [facility] Anderson, NP; Badger, MD; Beal, PA; Spencer, PA [address] [place], [place] [address] [phone] Mon-Thur 7:30-7:30, Fri 7:30-4:30 Babies seen by [facility] providers Accepting Medicaid [facility] Agbuya, MD; Klett, NP; Romgoolam, MD [address], [place], [place] [address] [phone] Mon-Fri 8:30-5:00, Sat 8:30-12:00 Providers come to see babies at [facility] Accepting Medicaid Must have "Meet & Greet" appointment at office prior to delivery [facility] ([facility] McCord, MD; Wallace, MD; Wood, MD [address], [place], [place] [address] [phone] Mon-Wed 8:00-6:00, Thur-Fri 8:00-5:00, Sat 9:00-12:00 Providers come to see babies at [facility] Does NOT accept Medicaid Only accepting siblings of current patients [facility]  [address], [place], [place]  [address] [phone]   Fax - [phone] [facility] [address], [place], [place]  [address] [phone]   Fax -  [phone]  [place]  ([address] & [address]) [facility] Cirigliano, DO; Matthews, DO [address] , [place] [address] [phone] Mon-Fri 7:00-5:00 Babies seen by [facility] providers Does NOT accept Medicaid [facility] Briscoe, MD; Howley, PA; Moreira, PA [address], [place], [place] [address] [phone] Mon-Fri 8:00-5:00 Babies seen by [facility] providers Accepting Medicaid [facility] Boyd, MD; Church, PA; Jones, NP; Osborn, PA [address], , [place] [address] [phone] Mon-Fri 8:00-5:00 Babies seen by providers at [facility] Accepting Medicaid  [place] ([address]) [facility] [place] Wendling, DO [address] [place], [place] [address] [phone] Mon-Fri 8:00-5:00 Babies seen by [facility] providers Does NOT accept Medicaid Limited availability, please call early in hospitalization to schedule follow-up [facility] Calderon, PA; Cummings, MD; Dillard, MD; Martin, PA; Olson, MD; VanDeven, PA [address], [place], [place] [address] [phone] Mon-Fri 8:30-5:00, Sat 9:00-12:00 Babies seen by providers at [facility] Accepting Medicaid Please register online then schedule online or call office [email or web address] Hunter, NP; Kumar, MD; Martin Rogers, PA [address], [place], [place] [address] [phone] Mon-Fri 8:00-5:00 Babies seen by providers at [facility] Accepting Medicaid [facility] ([facility]) Enigma, MD; Kristi Fleenor, NP; West, MD [address], [place], [place] [address] [phone] Mon-Fri 8:00-5:30, Sat&Sun by appointment (phones open at  8:30) Babies seen by [facility] providers Accepting Medicaid Must be a first-time baby or sibling of current patient [facility] [place]  [address], [place], [place]  [address] [phone]   Fax - [phone]  [place] ([address]) [facility] Brown, PA; Cowen, PA; Rice, MD; Helton, PA; Spry, MD [address] [place], [place] [address] [phone] Mon-Thur 8:00-7:00, Fri 8:00-5:00, Sat 8:00-12:00, Sun 9:00-12:00 Babies seen by [facility] providers Accepting Medicaid [facility] Coe-Goins, MD; Marshall, MD; Pierre-Louis, MD [address] [place], [place], [place] [address] [phone] Mon-Thur 8:00-5:00 Babies seen by providers at [facility] Accepting Medicaid [facility] Bratton, MD; Coe-Goins, MD; Hayes, MD; Lewis, MD; List, MD; Lott, MD; Marshall, MD; Moran, MD; O'Micheal Murad, MD; Pierre-Louis, MD; Pitonzo, MD; Scholer, MD; Spangle, MD [address] [place] [address] [place], [place] [address] [phone] Mon-Fri 8:00-5:30, Sat (Oct.-Mar.) 9:00-1:00 Babies seen by providers at [facility] Accepting Medicaid Must fill out new patient packet, available online at [email or web address] [facility] ([facility]) Friddle, NP; Harris, NP; Kelly, NP; Logan, MD; Melvin, PA; Poth, MD; Ramadoss, MD; Stanton, NP [address], [place], [place] [address] [phone] Mon-Thur 8:00-5:30, Fri 8:00-5:00 Babies seen by providers at [facility] Accepting Medicaid  [place] ([address]) [facility] Dixon, PA; Rosenberg, MD; Pickard, MD; Tapia, PA [address], [place], [place] [address] [phone] Mon-Fri 8:00-5:00 Babies seen by providers at [facility] Accepting Medicaid   [place] ([address]) [facility] [place] Masneri, DO; Meyers, MD; Nelson, PA [address], [place], [place] [address] [phone] Mon-Fri 8:00-5:00 Babies seen by providers at [facility] Does NOT accept Medicaid Limited appointment availability, please call early in hospitalization  [facility] [place] Kunedd, DO; McGowen, MD [address], [place], [place] [address] [phone] Mon-Fri 8:00-5:00 Babies seen by [facility] providers Does NOT accept Medicaid [facility] [place] Cameron, MD; MacDonald, MD; Michaels, PA; Nayak, MD [address] [place] [address], [place], [place] [address] [phone] Mon-Fri 8:00-5:00 After hours clinic ([address] [place],  [address]) [phone] Mon-Fri 5:00-8:00, Sat 12:00-6:00, Sun 10:00-4:00 Babies seen by [facility] providers Accepting Medicaid [facility] [place] [address], [place], [place]  [address] [phone]   Fax - [phone]  [place] ([address]) [facility] [place] [facility] Andy, MD [address], [place], [place] [address] [phone] Mon-Fri 8:00-5:00 Babies seen by [facility] providers Does NOT accept Medicaid [facility] [place] ([facility] [place]) Eksir, MD [address], [place], [place] [address] [phone] Mon-Thur 8:00-7:00, Fri 8:00-5:00, Sat 8:00-12:00 Babies seen by providers at [facility] Accepting Medicaid - but does not have vaccinations in office (must be received elsewhere) Limited availability, please call early in hospitalization  [place] ([address]) [facility]  Charlene Flemming, MD [address], [place] [address] [phone]  Fax [phone]  [facility]  Kimberly Newton, MD, Annamarie Streilein, PA, Carla Hampton, PA [address] [place], [place]  [address] [phone] [facility]  [address], [place], [place] [address] [phone] [address], [place], [place] [address] [phone] (West Office)  [facility] [address], [place], [place] [address] [phone] [facility] [address] [place], [place], [place] [address] [phone] [facility] [address], [place], [place] [address] [phone] [facility] [address], [place], [place] [address] [phone] [facility] [address], [place], [place] [address] [phone] [facility] [address], [place], [place] [address] [phone] [facility]  [address], [place], [place] [address] [phone] Dr[facility] [address], [place],  [address] [phone] [facility] [address], [place],  [address] [phone] [facility] [address], [place],  [address] [phone]   At our [facility], we work as an integrated team, providing care to address both physical and emotional health. Your medical provider may refer you to see our Behavioral Health Clinician (BHC) on the same day you see your medical provider, as availability permits; often scheduled virtually at your convenience.  Our BHC is available to all patients, visits generally last between 20-30 minutes, but can be longer or shorter, depending on patient need. The BHC offers help with stress management, coping with symptoms of depression and anxiety, major life changes , sleep issues, changing risky behavior, grief and loss, life stress, working on personal life goals, and  behavioral health issues, as these all affect your overall health and wellness.  The BHC is NOT available for the following: FMLA paperwork, court-ordered evaluations, specialty assessments (custody or disability), letters to employers, or  obtaining certification for an emotional support animal. The BHC does not provide long-term therapy. You have the right to refuse integrated behavioral health services, or to reschedule to see the BHC at a later date.  Confidentiality exception: If it is suspected that a child or disabled adult is being abused or neglected, we are required by law to report that to either Child Protective Services or Adult Protective Services.  If you have a diagnosis of Bipolar affective disorder, Schizophrenia, or recurrent Major depressive disorder, we will recommend that you establish care with a psychiatrist, as these are lifelong, chronic conditions, and we want your overall emotional health and medications to be more closely monitored. If you anticipate needing extended maternity leave due to mental health issues postpartum, it it recommended you inform your medical provider, so we can put in a referral to a psychiatrist as soon as possible. The BHC is unable to recommend an extended maternity leave for mental health issues. Your medical provider   or BHC may refer you to a therapist for ongoing, traditional therapy, or to a psychiatrist, for medication management, if it would benefit your overall health. Depending on your insurance, you may have a copay or be charged a deductible, depending on your insurance, to see the BHC. If you are uninsured, it is recommended that you apply for financial assistance. (Forms may be requested at the front desk for in-person visits, via MyChart, or request a form during a virtual visit).  If you see the BHC more than 6 times, you will have to complete a comprehensive clinical assessment interview with the BHC to resume integrated services.  For virtual visits with the BHC, you must be physically in the state of [place] at the time of the visit. For example, if you live in [place], you will have to do an in-person visit with the BHC, and your out-of-state insurance may not cover  behavioral health services in [place]. If you are going out of the state or country for any reason, the BHC may see you virtually when you return to [place], but not while you are physically outside of [place].   

## 2021-05-04 NOTE — Progress Notes (Signed)
New OB Intake  I connected with  Cassandra Bowen on 05/04/21 at  2:15 PM EDT by MyChart Video Visit and verified that I am speaking with the correct person using two identifiers. Nurse is located at [facility] and pt is located at home.  I discussed the limitations, risks, security and privacy concerns of performing an evaluation and management service by telephone and the availability of in person appointments. I also discussed with the patient that there may be a patient responsible charge related to this service. The patient expressed understanding and agreed to proceed.  I explained I am completing New OB Intake today. We discussed her EDD of 11/17/21 that is based on LMP of 02/10/21. Pt is G2/P0. I reviewed her allergies, medications, Medical/Surgical/OB history, and appropriate screenings. I informed her of [facility] services. Based on history, this is a/an  pregnancy uncomplicated .   Patient Active Problem List   Diagnosis Date Noted   Incomplete spontaneous abortion 12/17/2020    Concerns addressed today  Delivery Plans:  Plans to deliver at [facility].   MyChart/Babyscripts MyChart access verified. I explained pt will have some visits in office and some virtually. Babyscripts instructions given and order placed. Patient verifies receipt of registration text/e-mail. Account successfully created and app downloaded.  Blood Pressure Cuff  Blood pressure cuff ordered for patient to pick-up from [employer]. Explained after first prenatal appt pt will check weekly and document in Babyscripts.  Weight scale: Patient    have weight scale. Weight scale ordered for patient to pick up form [facility].   Anatomy US Explained first scheduled [place] will be around 19 weeks. Anatomy US scheduled for 06/23/21 at 10:15a. Pt notified to arrive at 10:00a.  Labs Discussed Avelina Laine genetic screening with patient. Would like both Panorama and Horizon drawn at new OB visit. Routine prenatal labs  needed.  Covid Vaccine Patient has not covid vaccine.   Mother/ Baby Dyad Candidate?    If yes, offer as possibility  Informed patient of [facility] website  and placed link in her AVS.   Social Determinants of Health Food Insecurity: Patient denies food insecurity. WIC Referral: Patient is interested in referral to [facility].  Transportation: Patient denies transportation needs. Childcare: Discussed no children allowed at ultrasound appointments. Offered childcare services; patient declines childcare services at this time.  Send link to Pregnancy Navigators   Placed OB Box on problem list and updated  First visit review I reviewed new OB appt with pt. I explained she will have a pelvic exam, ob bloodwork with genetic screening, and PAP smear. Explained pt will be seen by Raelyn Mora, CNM at first visit; encounter routed to appropriate provider. Explained that patient will be seen by pregnancy navigator following visit with provider. [facility] information placed in AVS.   Henrietta Dine, CMA 05/04/2021  2:25 PM

## 2021-05-04 NOTE — Progress Notes (Signed)
Patient was assessed and managed by nursing staff during this encounter. I have reviewed the chart and agree with the documentation and plan. I have also made any necessary editorial changes.  Raelyn Mora, CNM 05/04/2021 4:28 PM

## 2021-05-06 ENCOUNTER — Ambulatory Visit (INDEPENDENT_AMBULATORY_CARE_PROVIDER_SITE_OTHER): Payer: Medicaid Other | Admitting: Obstetrics and Gynecology

## 2021-05-06 ENCOUNTER — Other Ambulatory Visit (HOSPITAL_COMMUNITY)
Admission: RE | Admit: 2021-05-06 | Discharge: 2021-05-06 | Disposition: A | Discharge status: Home / Self Care | Payer: Medicaid Other | Source: Ambulatory Visit | Attending: Obstetrics and Gynecology | Admitting: Obstetrics and Gynecology

## 2021-05-06 ENCOUNTER — Other Ambulatory Visit: Payer: Self-pay

## 2021-05-06 VITALS — BP 105/72 | HR 81 | Wt 105.6 lb

## 2021-05-06 DIAGNOSIS — Z3480 Encounter for supervision of other normal pregnancy, unspecified trimester: Secondary | ICD-10-CM | POA: Diagnosis not present

## 2021-05-06 DIAGNOSIS — Z124 Encounter for screening for malignant neoplasm of cervix: Secondary | ICD-10-CM | POA: Diagnosis not present

## 2021-05-06 DIAGNOSIS — Z3A12 12 weeks gestation of pregnancy: Secondary | ICD-10-CM

## 2021-05-07 ENCOUNTER — Other Ambulatory Visit: Payer: Self-pay

## 2021-05-07 DIAGNOSIS — Z3A12 12 weeks gestation of pregnancy: Secondary | ICD-10-CM

## 2021-05-07 DIAGNOSIS — Z3480 Encounter for supervision of other normal pregnancy, unspecified trimester: Secondary | ICD-10-CM

## 2021-05-07 LAB — OBSTETRIC PANEL, INCLUDING HIV
Antibody Screen: NEGATIVE
Basophils Absolute: 0.1 10*3/uL (ref 0.0–0.2)
Basos: 1 %
EOS (ABSOLUTE): 0.1 10*3/uL (ref 0.0–0.4)
Eos: 1 %
HIV Screen 4th Generation wRfx: NONREACTIVE
Hematocrit: 39.6 % (ref 34.0–46.6)
Hemoglobin: 13 g/dL (ref 11.1–15.9)
Hepatitis B Surface Ag: NEGATIVE
Immature Grans (Abs): 0 10*3/uL (ref 0.0–0.1)
Immature Granulocytes: 0 %
Lymphocytes Absolute: 3.1 10*3/uL (ref 0.7–3.1)
Lymphs: 35 %
MCH: 28.7 pg (ref 26.6–33.0)
MCHC: 32.8 g/dL (ref 31.5–35.7)
MCV: 87 fL (ref 79–97)
Monocytes Absolute: 0.8 10*3/uL (ref 0.1–0.9)
Monocytes: 9 %
Neutrophils Absolute: 4.8 10*3/uL (ref 1.4–7.0)
Neutrophils: 54 %
Platelets: 453 10*3/uL — ABNORMAL HIGH (ref 150–450)
RBC: 4.53 x10E6/uL (ref 3.77–5.28)
RDW: 13.2 % (ref 11.7–15.4)
RPR Ser Ql: NONREACTIVE
Rh Factor: POSITIVE
Rubella Antibodies, IGG: 1.45 index (ref >0.99)
WBC: 8.8 10*3/uL (ref 3.4–10.8)

## 2021-05-07 LAB — CERVICOVAGINAL ANCILLARY ONLY
Bacterial Vaginitis (gardnerella): POSITIVE — AB
Candida Glabrata: NEGATIVE
Candida Vaginitis: POSITIVE — AB
Chlamydia: NEGATIVE
Comment: NEGATIVE
Comment: NEGATIVE
Comment: NEGATIVE
Comment: NEGATIVE
Comment: NEGATIVE
Comment: NORMAL
Neisseria Gonorrhea: NEGATIVE
Trichomonas: POSITIVE — AB

## 2021-05-07 LAB — CYTOLOGY - PAP: Diagnosis: NEGATIVE

## 2021-05-07 LAB — HEMOGLOBIN A1C
Est. average glucose Bld gHb Est-mCnc: 117 mg/dL
Hgb A1c MFr Bld: 5.7 % — ABNORMAL HIGH (ref 4.8–5.6)

## 2021-05-07 LAB — HEPATITIS C ANTIBODY: Hep C Virus Ab: <0.1 s/co ratio (ref 0.0–0.9)

## 2021-05-07 MED ORDER — M-NATAL PLUS 27-1 MG PO TABS: 1.0000 | ORAL_TABLET | Freq: Every day | ORAL | 12 refills | Status: AC

## 2021-05-08 LAB — CULTURE, OB URINE

## 2021-05-08 LAB — URINE CULTURE, OB REFLEX

## 2021-05-10 ENCOUNTER — Other Ambulatory Visit: Payer: Self-pay | Admitting: *Deleted

## 2021-05-10 ENCOUNTER — Encounter: Payer: Self-pay | Admitting: *Deleted

## 2021-05-10 ENCOUNTER — Encounter: Payer: Self-pay | Admitting: Obstetrics and Gynecology

## 2021-05-10 DIAGNOSIS — B3731 Acute candidiasis of vulva and vagina: Secondary | ICD-10-CM

## 2021-05-10 DIAGNOSIS — A599 Trichomoniasis, unspecified: Secondary | ICD-10-CM

## 2021-05-10 DIAGNOSIS — A5901 Trichomonal vulvovaginitis: Secondary | ICD-10-CM | POA: Insufficient documentation

## 2021-05-10 DIAGNOSIS — B9689 Other specified bacterial agents as the cause of diseases classified elsewhere: Secondary | ICD-10-CM

## 2021-05-10 MED ORDER — TERCONAZOLE 0.4 % VA CREA: 1.0000 | TOPICAL_CREAM | Freq: Every day | VAGINAL | 0 refills | Status: DC

## 2021-05-10 MED ORDER — METRONIDAZOLE 500 MG PO TABS: 500.0000 mg | ORAL_TABLET | Freq: Two times a day (BID) | ORAL | 0 refills | Status: DC
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
## 2021-06-06 ENCOUNTER — Encounter: Payer: Self-pay | Admitting: Obstetrics and Gynecology

## 2021-06-09 ENCOUNTER — Other Ambulatory Visit: Payer: Self-pay

## 2021-06-09 ENCOUNTER — Other Ambulatory Visit (HOSPITAL_COMMUNITY)
Admission: RE | Admit: 2021-06-09 | Discharge: 2021-06-09 | Disposition: A | Discharge status: Home / Self Care | Payer: Medicaid Other | Source: Ambulatory Visit | Attending: Advanced Practice Midwife | Admitting: Advanced Practice Midwife

## 2021-06-09 ENCOUNTER — Encounter: Payer: Self-pay | Admitting: Advanced Practice Midwife

## 2021-06-09 ENCOUNTER — Ambulatory Visit (INDEPENDENT_AMBULATORY_CARE_PROVIDER_SITE_OTHER): Payer: Medicaid Other | Admitting: Advanced Practice Midwife

## 2021-06-09 VITALS — BP 110/63 | HR 101 | Temp 98.1°F | Wt 114.0 lb

## 2021-06-09 DIAGNOSIS — Z3A17 17 weeks gestation of pregnancy: Secondary | ICD-10-CM | POA: Diagnosis not present

## 2021-06-09 DIAGNOSIS — O23599 Infection of other part of genital tract in pregnancy, unspecified trimester: Secondary | ICD-10-CM | POA: Insufficient documentation

## 2021-06-09 DIAGNOSIS — A5901 Trichomonal vulvovaginitis: Secondary | ICD-10-CM | POA: Insufficient documentation

## 2021-06-09 DIAGNOSIS — Z3480 Encounter for supervision of other normal pregnancy, unspecified trimester: Secondary | ICD-10-CM

## 2021-06-09 DIAGNOSIS — Z3490 Encounter for supervision of normal pregnancy, unspecified, unspecified trimester: Secondary | ICD-10-CM | POA: Diagnosis not present

## 2021-06-09 NOTE — Progress Notes (Signed)
   PRENATAL VISIT NOTE  Subjective:  Cassandra Bowen is a 30 y.o. G2P0010 at [redacted]w[redacted]d being seen today for ongoing prenatal care.  She is currently monitored for the following issues for this low-risk pregnancy and has Incomplete spontaneous abortion; Supervision of other normal pregnancy, antepartum; and Trichomonal vaginitis in pregnancy on their problem list.  Patient reports no complaints.  Contractions: Not present. Vag. Bleeding: None.  Movement: Present. Denies leaking of fluid.   The following portions of the patient's history were reviewed and updated as appropriate: allergies, current medications, past family history, past medical history, past social history, past surgical history and problem list.   Objective:   Vitals:   06/09/21 1006  BP: 110/63  Pulse: (!) 101  Temp: 98.1 F (36.7 C)  Weight: 51.7 kg    Fetal Status: Fetal Heart Rate (bpm): 143   Movement: Present     General:  Alert, oriented and cooperative. Patient is in no acute distress.  Skin: Skin is warm and dry. No rash noted.   Cardiovascular: Normal heart rate noted  Respiratory: Normal respiratory effort, no problems with respiration noted  Abdomen: Soft, gravid, appropriate for gestational age.  Pain/Pressure: Absent     Pelvic: Cervical exam deferred        Extremities: Normal range of motion.  Edema: None  Mental Status: Normal mood and affect. Normal behavior. Normal judgment and thought content.   Assessment and Plan:  Pregnancy: G2P0010 at [redacted]w[redacted]d 1. Trichomonal vaginitis during pregnancy, antepartum - Urine cytology ancillary only([place])  2. Supervision of other normal pregnancy, antepartum   3. [redacted] weeks gestation of pregnancy - Patient declines AFP today. She would like to wait. Understand the timing needed for the test.   Preterm labor symptoms and general obstetric precautions including but not limited to vaginal bleeding, contractions, leaking of fluid and fetal movement were reviewed  in detail with the patient. Please refer to After Visit Summary for other counseling recommendations.   Return in about 4 weeks (around 07/07/2021).  Future Appointments  Date Time Provider Department Center  06/09/2021  2:15 PM Armando Reichert, CNM [facility] None  06/23/2021 10:15 AM [facility] US2 [facility]    Thressa Sheller DNP, CNM  06/09/21  10:31 AM

## 2021-06-14 ENCOUNTER — Encounter: Payer: Self-pay | Admitting: Obstetrics and Gynecology

## 2021-06-15 LAB — URINE CYTOLOGY ANCILLARY ONLY
Chlamydia: NEGATIVE
Comment: NEGATIVE
Comment: NEGATIVE
Comment: NORMAL
Neisseria Gonorrhea: NEGATIVE
Trichomonas: NEGATIVE

## 2021-06-23 ENCOUNTER — Ambulatory Visit: Payer: Medicaid Other | Attending: Obstetrics and Gynecology

## 2021-06-23 ENCOUNTER — Other Ambulatory Visit: Payer: Self-pay

## 2021-06-23 ENCOUNTER — Other Ambulatory Visit: Payer: Self-pay | Admitting: *Deleted

## 2021-06-23 ENCOUNTER — Other Ambulatory Visit: Payer: Self-pay | Admitting: Obstetrics and Gynecology

## 2021-06-23 DIAGNOSIS — Z3480 Encounter for supervision of other normal pregnancy, unspecified trimester: Secondary | ICD-10-CM | POA: Diagnosis not present

## 2021-06-23 DIAGNOSIS — Z3689 Encounter for other specified antenatal screening: Secondary | ICD-10-CM

## 2021-07-07 ENCOUNTER — Ambulatory Visit (INDEPENDENT_AMBULATORY_CARE_PROVIDER_SITE_OTHER): Payer: Medicaid Other | Admitting: Obstetrics and Gynecology

## 2021-07-07 ENCOUNTER — Other Ambulatory Visit: Payer: Self-pay

## 2021-07-07 ENCOUNTER — Encounter: Payer: Self-pay | Admitting: Obstetrics and Gynecology

## 2021-07-07 VITALS — BP 103/63 | HR 92 | Temp 97.3°F | Wt 119.6 lb

## 2021-07-07 DIAGNOSIS — Z3480 Encounter for supervision of other normal pregnancy, unspecified trimester: Secondary | ICD-10-CM

## 2021-07-07 DIAGNOSIS — Z3A21 21 weeks gestation of pregnancy: Secondary | ICD-10-CM

## 2021-07-07 NOTE — Progress Notes (Signed)
° °  LOW-RISK PREGNANCY OFFICE VISIT Patient name: Cassandra Bowen MRN [id number]  Date of birth: 1990-12-24 Chief Complaint:   Routine Prenatal Visit  History of Present Illness:   Cassandra Bowen is a 30 y.o. G2P0010 female at [redacted]w[redacted]d with an Estimated Date of Delivery: 11/17/21 being seen today for ongoing management of a low-risk pregnancy.  Today she reports  she had brown discharge 2 days ago x 1 episode, but none now . Contractions: Not present. Vag. Bleeding: None.  Movement: Present. denies leaking of fluid. Review of Systems:   Pertinent items are noted in HPI Denies abnormal vaginal discharge w/ itching/odor/irritation, headaches, visual changes, shortness of breath, chest pain, abdominal pain, severe nausea/vomiting, or problems with urination or bowel movements unless otherwise stated above. Pertinent History Reviewed:  Reviewed past medical,surgical, social, obstetrical and family history.  Reviewed problem list, medications and allergies. Physical Assessment:   Vitals:   07/07/21 1004  BP: 103/63  Pulse: 92  Temp: (!) 97.3 F (36.3 C)  Weight: 119 lb 9.6 oz (54.3 kg)  Body mass index is 22.23 kg/m.        Physical Examination:   General appearance: Well appearing, and in no distress  Mental status: Alert, oriented to person, place, and time  Skin: Warm & dry  Cardiovascular: Normal heart rate noted  Respiratory: Normal respiratory effort, no distress  Abdomen: Soft, gravid, nontender  Pelvic: Cervical exam deferred         Extremities: Edema: None  Fetal Status: Fetal Heart Rate (bpm): 146 Fundal Height: 21 cm Movement: Present    No results found for this or any previous visit (from the past 24 hour(s)).  Assessment & Plan:  1) Low-risk pregnancy G2P0010 at [redacted]w[redacted]d with an Estimated Date of Delivery: 11/17/21   2) Supervision of other normal pregnancy, antepartum - F/U US on 08/04/2021  3) [redacted] weeks gestation of pregnancy    Meds: No orders of the defined types  were placed in this encounter.  Labs/procedures today: none  Plan:  Continue routine obstetrical care   Reviewed: Preterm labor symptoms and general obstetric precautions including but not limited to vaginal bleeding, contractions, leaking of fluid and fetal movement were reviewed in detail with the patient.  All questions were answered. Has home bp cuff. Check bp weekly, let us know if >140/90.   Follow-up: No follow-ups on file.  No orders of the defined types were placed in this encounter.  Raelyn Mora MSN, CNM 07/07/2021 12:02 PM

## 2021-07-11 NOTE — L&D Delivery Note (Addendum)
Vaginal Delivery Note ? ?Pre-procedure Diagnosis: SIUP @ [redacted]w[redacted]d ?Indications: 31 y.o. G2P0010 Estimated Date of Delivery: 11/17/21 here today for Elective at term. 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 Her labor course was uncomplicated.   ?Post-procedure Diagnosis: SIUP @ [redacted]w[redacted]d ; same ? ?Provider: Ardyth Harps, SNM; Jacklyn Shell CNm ? ?Anesthesia: epidural ? ?Complications: none ? ?Delivery Estimated Blood Loss (EBL): 150  mL ? ?Transfusions: none ? ?Pathology: placenta to l&d routinely ? ?Labor Events: ?Rupture date: 11/18/2021 , at 4:07 AM .  ?Rupture type: Artificial [2];Intact [6]  ?Fluid characteristic: Clear [1]  ?Interval from ROM to Delivery: 17h 6m ?Induction: AROM [2];Pitocin [5];Misoprostol [4];IP Foley [6]  ?Augmentation:  cytotec, pitocin ?Sex: F ? ?Delivery Information for @BABYINFO (NAME)@ ?Time of Birth: 10:01 PM  ?Baby Weight:  pending ? ?APGARS One minute Five minutes ?Totals: 8  9   ?Newborn is AGA and well. Patient plans to breastfeed.   ? ?Procedure:  ?Called to bs for adequate maternal pushing efforts. The patient was in semi fowlers position, draped under in a routine fashion. Significant fetal descent with expulsive efforts, IUPC removed without incident upon crowning.    ?SVD of a viable female infant in cephalic presentation delivered spontaneously over a supported perineum. No nuchal cord. No dystocia. No meconium present. Nose and mouth suctioned with bulb; cord clamped and cut. The placenta was then delivered spontaneously and intact via CCT; ; [facility]. The uterine fundus was firm after uterine massage and with a 500 ml bolus of 20 units of pitocin. Inspection revealed a 1st degree laceration which was repaired with 3-0 vicryl CT-1. Patient tolerated the procedure well. Instrument, sponge, and needle counts were correct at the end of the procedure. ? ? ?Disposition: stable to  transfer to [facility] ? ? ?Electronically Signed By: ?[facility], SNM ?11/18/21 10:59 PM   ? ?The above was performed under my direct supervision and guidance.  ? ?

## 2021-08-04 ENCOUNTER — Other Ambulatory Visit: Payer: Self-pay

## 2021-08-04 ENCOUNTER — Ambulatory Visit: Payer: Medicaid Other | Attending: Obstetrics and Gynecology

## 2021-08-04 ENCOUNTER — Ambulatory Visit: Payer: Medicaid Other | Admitting: *Deleted

## 2021-08-04 ENCOUNTER — Encounter: Payer: Self-pay | Admitting: *Deleted

## 2021-08-04 ENCOUNTER — Other Ambulatory Visit: Payer: Self-pay | Admitting: *Deleted

## 2021-08-04 VITALS — BP 106/59 | HR 87

## 2021-08-04 DIAGNOSIS — Z3689 Encounter for other specified antenatal screening: Secondary | ICD-10-CM | POA: Insufficient documentation

## 2021-08-04 DIAGNOSIS — Z362 Encounter for other antenatal screening follow-up: Secondary | ICD-10-CM

## 2021-08-04 DIAGNOSIS — O365990 Maternal care for other known or suspected poor fetal growth, unspecified trimester, not applicable or unspecified: Secondary | ICD-10-CM

## 2021-08-04 DIAGNOSIS — Z3A25 25 weeks gestation of pregnancy: Secondary | ICD-10-CM | POA: Diagnosis not present

## 2021-08-04 NOTE — Progress Notes (Signed)
C/o"spotting x 1 day in [place] since."

## 2021-08-05 ENCOUNTER — Telehealth (INDEPENDENT_AMBULATORY_CARE_PROVIDER_SITE_OTHER): Payer: Medicaid Other | Admitting: Obstetrics and Gynecology

## 2021-08-05 ENCOUNTER — Encounter: Payer: Self-pay | Admitting: Obstetrics and Gynecology

## 2021-08-05 VITALS — BP 96/60 | HR 83

## 2021-08-05 DIAGNOSIS — Z3A25 25 weeks gestation of pregnancy: Secondary | ICD-10-CM

## 2021-08-05 DIAGNOSIS — Z3480 Encounter for supervision of other normal pregnancy, unspecified trimester: Secondary | ICD-10-CM

## 2021-08-05 DIAGNOSIS — Z3482 Encounter for supervision of other normal pregnancy, second trimester: Secondary | ICD-10-CM

## 2021-08-05 NOTE — Progress Notes (Signed)
° °  MY CHART VIDEO VIRTUAL OBSTETRICS VISIT ENCOUNTER NOTE  I connected with Cassandra Bowen on 08/05/21 at 10:55 AM EST by My Chart video at home and verified that I am speaking with the correct person using two identifiers. Provider located at [employer] for [employer] at [place].   I discussed the limitations, risks, security and privacy concerns of performing an evaluation and management service by My Chart video and the availability of in person appointments. I also discussed with the patient that there may be a patient responsible charge related to this service. The patient expressed understanding and agreed to proceed.  I discussed the limitations of telemedicine and the availability of in person appointments.  Discussed there is a possibility of technology failure and discussed alternative modes of communication if that failure occurs.  Subjective:  Cassandra Bowen is a 31 y.o. G2P0010 at [redacted]w[redacted]d being followed for ongoing prenatal care.  She is currently monitored for the following issues for this low-risk pregnancy and has Incomplete spontaneous abortion; Supervision of other normal pregnancy, antepartum; and Trichomonal vaginitis in pregnancy on their problem list.  Patient reports no complaints. Reports fetal movement. Denies any contractions, bleeding or leaking of fluid.   The following portions of the patient's history were reviewed and updated as appropriate: allergies, current medications, past family history, past medical history, past social history, past surgical history and problem list.   Objective:   General:  Alert, oriented and cooperative.   Mental Status: Normal mood and affect perceived. Normal judgment and thought content.  Rest of physical exam deferred due to type of encounter  BP 96/60    Pulse 83    LMP 02/10/2021 (Exact Date)  **Done by patient's own at home BP cuff and scale  Assessment and Plan:  Pregnancy: G2P0010 at [redacted]w[redacted]d  1. Supervision of other  normal pregnancy, antepartum - Anticipatory guidance for 2 hr GTT - advised to fast after midnight without anything to eat or drink (except for water), will have fasting blood drawn, drink the glucola drink (flavor choices: orange, fruit punch or lemon-lime), have a visit with a provider during the first hour of testing, wait in the lab waiting room to have blood drawn at 1 hour and then 2 hours after finishing glucola drink.   Preterm labor symptoms and general obstetric precautions including but not limited to vaginal bleeding, contractions, leaking of fluid and fetal movement were reviewed in detail with the patient.  I discussed the assessment and treatment plan with the patient. The patient was provided an opportunity to ask questions and all were answered. The patient agreed with the plan and demonstrated an understanding of the instructions. The patient was advised to call back or seek an in-person office evaluation/go to [facility] at [facility] for any urgent or concerning symptoms. Please refer to After Visit Summary for other counseling recommendations.   I provided 5 minutes of non-face-to-face time during this encounter. There was 5 minutes of chart review time spent prior to this encounter. Total time spent = 10 minutes.  Return in 20 days (on 08/25/2021) for Return OB 2hr GTT.  Future Appointments  Date Time Provider [place]  08/25/2021  8:35 AM Gabriel Carina, CNM [facility] None  08/25/2021  1:30 PM [facility] NURSE [facility]  08/25/2021  1:45 PM [facility] US6 [facility]  09/23/2021  9:35 AM Laury Deep, CNM [facility] None    Laury Deep, [place] for [employer], [place]

## 2021-08-05 NOTE — Patient Instructions (Signed)
Oral Glucose Tolerance Test During Pregnancy °Why am I having this test? °The oral glucose tolerance test (OGTT) is done to check how your body processes blood sugar (glucose). This is one of several tests used to diagnose diabetes that develops during pregnancy (gestational diabetes mellitus). Gestational diabetes is a short-term form of diabetes that some women develop while they are pregnant. It usually occurs during the second trimester of pregnancy and goes away after delivery. °Testing, or screening, for gestational diabetes usually occurs at weeks 24-28 of pregnancy. You may have the OGTT test after having a 1-hour glucose screening test if the results from that test indicate that you may have gestational diabetes. This test may also be needed if: °You have a history of gestational diabetes. °There is a history of giving birth to very large babies or of losing pregnancies (having stillbirths). °You have signs and symptoms of diabetes, such as: °Changes in your eyesight. °Tingling or numbness in your hands or feet. °Changes in hunger, thirst, and urination, and these are not explained by your pregnancy. °What is being tested? °This test measures the amount of glucose in your blood at different times during a period of 3 hours. This shows how well your body can process glucose. °What kind of sample is taken? °Blood samples are required for this test. They are usually collected by inserting a needle into a blood vessel. °How do I prepare for this test? °For 3 days before your test, eat normally. Have plenty of carbohydrate-rich foods. °Follow instructions from your health care provider about: °Eating or drinking restrictions on the day of the test. You may be asked not to eat or drink anything other than water (to fast) starting 8-10 hours before the test. °Changing or stopping your regular medicines. Some medicines may interfere with this test. °Tell a health care provider about: °All medicines you are taking,  including vitamins, herbs, eye drops, creams, and over-the-counter medicines. °Any blood disorders you have. °Any surgeries you have had. °Any medical conditions you have. °What happens during the test? °First, your blood glucose will be measured. This is referred to as your fasting blood glucose because you fasted before the test. Then, you will drink a glucose solution that contains a certain amount of glucose. Your blood glucose will be measured again 1, 2, and 3 hours after you drink the solution. °This test takes about 3 hours to complete. You will need to stay at the testing location during this time. During the testing period: °Do not eat or drink anything other than the glucose solution. °Do not exercise. °Do not use any products that contain nicotine or tobacco, such as cigarettes, e-cigarettes, and chewing tobacco. These can affect your test results. If you need help quitting, ask your health care provider. °The testing procedure may vary among health care providers and hospitals. °How are the results reported? °Your results will be reported as milligrams of glucose per deciliter of blood (mg/dL) or millimoles per liter (mmol/L). There is more than one source for screening and diagnosis reference values used to diagnose gestational diabetes. Your health care provider will compare your results to normal values that were established after testing a large group of people (reference values). Reference values may vary among labs and hospitals. For this test ([facility]), reference values are: °Fasting: 95 mg/dL (5.3 mmol/L). °1 hour: 180 mg/dL (10.0 mmol/L). °2 hour: 155 mg/dL (8.6 mmol/L). °3 hour: 140 mg/dL (7.8 mmol/L). °What do the results mean? °Results below the reference values are considered   normal. If two or more of your blood glucose levels are at or above the reference values, you may be diagnosed with gestational diabetes. If only one level is high, your health care provider may suggest  repeat testing or other tests to confirm a diagnosis. °Talk with your health care provider about what your results mean. °Questions to ask your health care provider °Ask your health care provider, or the department that is doing the test: °When will my results be ready? °How will I get my results? °What are my treatment options? °What other tests do I need? °What are my next steps? °Summary °The oral glucose tolerance test (OGTT) is one of several tests used to diagnose diabetes that develops during pregnancy (gestational diabetes mellitus). Gestational diabetes is a short-term form of diabetes that some women develop while they are pregnant. °You may have the OGTT test after having a 1-hour glucose screening test if the results from that test show that you may have gestational diabetes. You may also have this test if you have any symptoms or risk factors for this type of diabetes. °Talk with your health care provider about what your results mean. °This information is not intended to replace advice given to you by your health care provider. Make sure you discuss any questions you have with your health care provider. °Document Revised: 12/05/2019 Document Reviewed: 12/05/2019 °Elsevier Patient Education © 2022 [employer] ° °

## 2021-08-11 ENCOUNTER — Telehealth: Payer: Self-pay

## 2021-08-11 NOTE — Telephone Encounter (Signed)
Patient reports abdominal cramps. Reports daily BM. Encouraged patient to take 2 extra strength Tylenol and drink 2 glasses of water. Patient will call back if no relief after resting for 1 hour.

## 2021-08-18 ENCOUNTER — Encounter (HOSPITAL_COMMUNITY): Payer: Self-pay | Admitting: Family Medicine

## 2021-08-18 ENCOUNTER — Other Ambulatory Visit: Payer: Self-pay

## 2021-08-18 ENCOUNTER — Inpatient Hospital Stay (HOSPITAL_COMMUNITY)
Admission: AD | Admit: 2021-08-18 | Discharge: 2021-08-18 | Disposition: A | Discharge status: Home / Self Care | Payer: Medicaid Other | Attending: Family Medicine | Admitting: Family Medicine

## 2021-08-18 DIAGNOSIS — O26852 Spotting complicating pregnancy, second trimester: Secondary | ICD-10-CM | POA: Diagnosis not present

## 2021-08-18 DIAGNOSIS — R102 Pelvic and perineal pain: Secondary | ICD-10-CM | POA: Diagnosis not present

## 2021-08-18 DIAGNOSIS — R109 Unspecified abdominal pain: Secondary | ICD-10-CM

## 2021-08-18 DIAGNOSIS — O99891 Other specified diseases and conditions complicating pregnancy: Secondary | ICD-10-CM | POA: Diagnosis not present

## 2021-08-18 DIAGNOSIS — R1084 Generalized abdominal pain: Secondary | ICD-10-CM | POA: Diagnosis not present

## 2021-08-18 DIAGNOSIS — O26892 Other specified pregnancy related conditions, second trimester: Secondary | ICD-10-CM

## 2021-08-18 DIAGNOSIS — Z3A27 27 weeks gestation of pregnancy: Secondary | ICD-10-CM

## 2021-08-18 DIAGNOSIS — O039 Complete or unspecified spontaneous abortion without complication: Secondary | ICD-10-CM | POA: Diagnosis not present

## 2021-08-18 DIAGNOSIS — N949 Unspecified condition associated with female genital organs and menstrual cycle: Secondary | ICD-10-CM

## 2021-08-18 LAB — CBC
HCT: 31 % — ABNORMAL LOW (ref 36.0–46.0)
Hemoglobin: 10.4 g/dL — ABNORMAL LOW (ref 12.0–15.0)
MCH: 29.9 pg (ref 26.0–34.0)
MCHC: 33.5 g/dL (ref 30.0–36.0)
MCV: 89.1 fL (ref 80.0–100.0)
Platelets: 344 10*3/uL (ref 150–400)
RBC: 3.48 MIL/uL — ABNORMAL LOW (ref 3.87–5.11)
RDW: 13.2 % (ref 11.5–15.5)
WBC: 8.4 10*3/uL (ref 4.0–10.5)
nRBC: 0 % (ref 0.0–0.2)

## 2021-08-18 LAB — URINALYSIS, ROUTINE W REFLEX MICROSCOPIC
Bilirubin Urine: NEGATIVE
Glucose, UA: NEGATIVE mg/dL
Hgb urine dipstick: NEGATIVE
Ketones, ur: 20 mg/dL — AB
Leukocytes,Ua: NEGATIVE
Nitrite: NEGATIVE
Protein, ur: NEGATIVE mg/dL
Specific Gravity, Urine: 1.018 (ref 1.005–1.030)
pH: 7 (ref 5.0–8.0)
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
## 2021-08-18 NOTE — MAU Note (Signed)
Cassandra Bowen is a 31 y.o. at [redacted]w[redacted]d here in MAU reporting: arrived via EMS complaining of spotting that started last night. States she did have intercourse last night. Having some abdominal pain when moving. No LOF. +FM  Onset of complaint: last night  Pain score: 3/10  Vitals:   08/18/21 1758  BP: 100/68  Pulse: 100  Resp: 16  Temp: 98.9 F (37.2 C)  SpO2: 97%     FHT:EFM applied in room  Lab orders placed from triage: UA

## 2021-08-19 LAB — GC/CHLAMYDIA PROBE AMP (~~LOC~~) NOT AT ARMC
Chlamydia: NEGATIVE
Comment: NEGATIVE
Comment: NORMAL
Neisseria Gonorrhea: NEGATIVE

## 2021-08-19 LAB — WET PREP, GENITAL
Clue Cells Wet Prep HPF POC: NONE SEEN
Sperm: NONE SEEN
Trich, Wet Prep: NONE SEEN
WBC, Wet Prep HPF POC: <10 (ref <10)
Yeast Wet Prep HPF POC: NONE SEEN

## 2021-08-25 ENCOUNTER — Ambulatory Visit (INDEPENDENT_AMBULATORY_CARE_PROVIDER_SITE_OTHER): Payer: Medicaid Other | Admitting: Certified Nurse Midwife

## 2021-08-25 ENCOUNTER — Other Ambulatory Visit: Payer: Self-pay

## 2021-08-25 ENCOUNTER — Ambulatory Visit: Payer: Medicaid Other | Admitting: *Deleted

## 2021-08-25 ENCOUNTER — Ambulatory Visit: Payer: Medicaid Other | Attending: Maternal & Fetal Medicine

## 2021-08-25 VITALS — BP 101/60 | HR 85

## 2021-08-25 VITALS — BP 106/65 | HR 90 | Temp 98.3°F | Wt 123.8 lb

## 2021-08-25 DIAGNOSIS — Z3A28 28 weeks gestation of pregnancy: Secondary | ICD-10-CM | POA: Diagnosis not present

## 2021-08-25 DIAGNOSIS — Z362 Encounter for other antenatal screening follow-up: Secondary | ICD-10-CM | POA: Insufficient documentation

## 2021-08-25 DIAGNOSIS — Z148 Genetic carrier of other disease: Secondary | ICD-10-CM | POA: Insufficient documentation

## 2021-08-25 DIAGNOSIS — O365930 Maternal care for other known or suspected poor fetal growth, third trimester, not applicable or unspecified: Secondary | ICD-10-CM | POA: Diagnosis not present

## 2021-08-25 DIAGNOSIS — O365990 Maternal care for other known or suspected poor fetal growth, unspecified trimester, not applicable or unspecified: Secondary | ICD-10-CM

## 2021-08-25 DIAGNOSIS — A5901 Trichomonal vulvovaginitis: Secondary | ICD-10-CM

## 2021-08-25 DIAGNOSIS — Z3493 Encounter for supervision of normal pregnancy, unspecified, third trimester: Secondary | ICD-10-CM

## 2021-08-25 DIAGNOSIS — O23592 Infection of other part of genital tract in pregnancy, second trimester: Secondary | ICD-10-CM

## 2021-08-25 DIAGNOSIS — Z3480 Encounter for supervision of other normal pregnancy, unspecified trimester: Secondary | ICD-10-CM

## 2021-08-25 DIAGNOSIS — D508 Other iron deficiency anemias: Secondary | ICD-10-CM

## 2021-08-25 DIAGNOSIS — N949 Unspecified condition associated with female genital organs and menstrual cycle: Secondary | ICD-10-CM

## 2021-08-25 NOTE — Progress Notes (Signed)
° °  PRENATAL VISIT NOTE  Subjective:  Cassandra Bowen is a 31 y.o. G2P0010 at [redacted]w[redacted]d being seen today for ongoing prenatal care.  She is currently monitored for the following issues for this low-risk pregnancy and has Incomplete spontaneous abortion; Supervision of other normal pregnancy, antepartum; and Trichomonal vaginitis in pregnancy on their problem list.  Patient reports  slowly improving round ligament pain - works best if she stretches and pushes the baby off her right side. Otherwise doing well .  Contractions: Irregular. Vag. Bleeding: None.  Movement: Present. Denies leaking of fluid.   The following portions of the patient's history were reviewed and updated as appropriate: allergies, current medications, past family history, past medical history, past social history, past surgical history and problem list.   Objective:   Vitals:   08/25/21 0835  BP: 106/65  Pulse: 90  Temp: 98.3 F (36.8 C)  Weight: 123 lb 12.8 oz (56.2 kg)    Fetal Status: Fetal Heart Rate (bpm): 144 Fundal Height: 28 cm Movement: Present     General:  Alert, oriented and cooperative. Patient is in no acute distress.  Skin: Skin is warm and dry. No rash noted.   Cardiovascular: Normal heart rate noted  Respiratory: Normal respiratory effort, no problems with respiration noted  Abdomen: Soft, gravid, appropriate for gestational age.  Pain/Pressure: Present     Pelvic: Cervical exam deferred        Extremities: Normal range of motion.  Edema: None  Mental Status: Normal mood and affect. Normal behavior. Normal judgment and thought content.   Assessment and Plan:  Pregnancy: G2P0010 at [redacted]w[redacted]d 1. Supervision of low-risk pregnancy, third trimester - Doing well, feeling regular and vigorous fetal movement   2. [redacted] weeks gestation of pregnancy - Routine OB care  - Glucose Tolerance, 2 Hours w/1 Hour - HIV Antibody (routine testing w rflx) - RPR - CBC  3. Round ligament pain - Demonstrated round  ligament massage (praised her for intuitively knowing what to do) and instructed her on twice daily massage/stretching. Pt verbalized understanding.  Preterm labor symptoms and general obstetric precautions including but not limited to vaginal bleeding, contractions, leaking of fluid and fetal movement were reviewed in detail with the patient. Please refer to After Visit Summary for other counseling recommendations.   Return in about 2 weeks (around 09/08/2021) for IN-PERSON, LOB.  Future Appointments  Date Time Provider [place]  08/25/2021  1:30 PM [facility] NURSE [facility]  08/25/2021  1:45 PM [facility]  09/08/2021  1:15 PM Griffin Basil, MD [facility]    Gabriel Carina, CNM

## 2021-08-26 ENCOUNTER — Other Ambulatory Visit: Payer: Self-pay | Admitting: *Deleted

## 2021-08-26 DIAGNOSIS — D563 Thalassemia minor: Secondary | ICD-10-CM

## 2021-08-26 DIAGNOSIS — O365990 Maternal care for other known or suspected poor fetal growth, unspecified trimester, not applicable or unspecified: Secondary | ICD-10-CM

## 2021-08-26 DIAGNOSIS — Z3689 Encounter for other specified antenatal screening: Secondary | ICD-10-CM

## 2021-08-26 LAB — GLUCOSE TOLERANCE, 2 HOURS W/ 1HR
Glucose, 1 hour: 144 mg/dL (ref 70–179)
Glucose, 2 hour: 93 mg/dL (ref 70–152)
Glucose, Fasting: 83 mg/dL (ref 70–91)

## 2021-08-26 LAB — CBC
Hematocrit: 29.4 % — ABNORMAL LOW (ref 34.0–46.6)
Hemoglobin: 9.7 g/dL — ABNORMAL LOW (ref 11.1–15.9)
MCH: 29.4 pg (ref 26.6–33.0)
MCHC: 33 g/dL (ref 31.5–35.7)
MCV: 89 fL (ref 79–97)
Platelets: 330 10*3/uL (ref 150–450)
RBC: 3.3 x10E6/uL — ABNORMAL LOW (ref 3.77–5.28)
RDW: 12.3 % (ref 11.7–15.4)
WBC: 8 10*3/uL (ref 3.4–10.8)

## 2021-08-26 LAB — RPR: RPR Ser Ql: NONREACTIVE

## 2021-08-26 LAB — HIV ANTIBODY (ROUTINE TESTING W REFLEX): HIV Screen 4th Generation wRfx: NONREACTIVE

## 2021-08-27 MED ORDER — HEMATEX IRON COMPLEX 150 MG PO TABS: 1.0000 | ORAL_TABLET | ORAL | 3 refills | Status: DC

## 2021-08-27 NOTE — Addendum Note (Signed)
Addended by: Gaylan Gerold on: 08/27/2021 08:35 AM   Modules accepted: Orders

## 2021-09-08 ENCOUNTER — Other Ambulatory Visit: Payer: Self-pay

## 2021-09-08 ENCOUNTER — Ambulatory Visit (INDEPENDENT_AMBULATORY_CARE_PROVIDER_SITE_OTHER): Payer: Medicaid Other | Admitting: Obstetrics and Gynecology

## 2021-09-08 VITALS — BP 97/60 | HR 90 | Wt 119.4 lb

## 2021-09-08 DIAGNOSIS — Z3A30 30 weeks gestation of pregnancy: Secondary | ICD-10-CM

## 2021-09-08 DIAGNOSIS — Z9081 Acquired absence of spleen: Secondary | ICD-10-CM

## 2021-09-08 DIAGNOSIS — Z3480 Encounter for supervision of other normal pregnancy, unspecified trimester: Secondary | ICD-10-CM

## 2021-09-08 DIAGNOSIS — D563 Thalassemia minor: Secondary | ICD-10-CM

## 2021-09-08 NOTE — Progress Notes (Signed)
? ?  PRENATAL VISIT NOTE ? ?Subjective:  ?Cassandra Bowen is a 31 y.o. G2P0010 at [redacted]w[redacted]d being seen today for ongoing prenatal care.  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 ? ?Patient doing well with no acute concerns today. She reports no complaints.  Contractions: Irritability. Vag. Bleeding: None.  Movement: Present. Denies leaking of fluid.  ? ?The following portions of the patient's history were reviewed and updated as appropriate: allergies, current medications, past family history, past medical history, past social history, past surgical history and problem list. Problem list updated. ? ?Objective:  ? ?Vitals:  ? 09/08/21 1322  ?BP: 97/60  ?Pulse: 90  ?Weight: 119 lb 6.4 oz (54.2 kg)  ? ? ?Fetal Status: Fetal Heart Rate (bpm): 140 Fundal Height: 30 cm Movement: Present    ? ?General:  Alert, oriented and cooperative. Patient is in no acute distress.  ?Skin: Skin is warm and dry. No rash noted.   ?Cardiovascular: Normal heart rate noted  ?Respiratory: Normal respiratory effort, no problems with respiration noted  ?Abdomen: Soft, gravid, appropriate for gestational age.  Pain/Pressure: Present     ?Pelvic: Cervical exam deferred        ?Extremities: Normal range of motion.  Edema: Trace  ?Mental Status:  Normal mood and affect. Normal behavior. Normal judgment and thought content.  ? ?Assessment and Plan:  ?Pregnancy: G2P0010 at [redacted]w[redacted]d ? ?1. [redacted] weeks gestation of pregnancy ? ? ?2. Supervision of other normal pregnancy, antepartum ?Continue routine care ? ?3. Alpha thalassemia silent carrier ? ? ?4. 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 ?Preterm labor symptoms and general obstetric precautions including but not limited to  vaginal bleeding, contractions, leaking of fluid and fetal movement were reviewed in detail with the patient. ? ?Please refer to After Visit Summary for other counseling recommendations.  ? ?Return in about 2 weeks (around 09/22/2021) for LOB, in person. ? ? ?Mariel Aloe, MD ?Faculty Attending ?[facility] ?  ?

## 2021-09-22 ENCOUNTER — Ambulatory Visit (INDEPENDENT_AMBULATORY_CARE_PROVIDER_SITE_OTHER): Payer: Medicaid Other | Admitting: Family Medicine

## 2021-09-22 ENCOUNTER — Encounter: Payer: Self-pay | Admitting: Family Medicine

## 2021-09-22 ENCOUNTER — Other Ambulatory Visit: Payer: Self-pay

## 2021-09-22 VITALS — BP 112/62 | HR 95 | Wt 123.5 lb

## 2021-09-22 DIAGNOSIS — Z9081 Acquired absence of spleen: Secondary | ICD-10-CM

## 2021-09-22 DIAGNOSIS — A5901 Trichomonal vulvovaginitis: Secondary | ICD-10-CM

## 2021-09-22 DIAGNOSIS — O23591 Infection of other part of genital tract in pregnancy, first trimester: Secondary | ICD-10-CM

## 2021-09-22 DIAGNOSIS — Z3A32 32 weeks gestation of pregnancy: Secondary | ICD-10-CM

## 2021-09-22 DIAGNOSIS — Z3480 Encounter for supervision of other normal pregnancy, unspecified trimester: Secondary | ICD-10-CM

## 2021-09-22 NOTE — Progress Notes (Signed)
? ? ?  Subjective:  ?Cassandra Bowen is a 31 y.o. G2P0010 at [redacted]w[redacted]d being seen today for ongoing prenatal care.  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 ? ?Patient reports no complaints.  Contractions: Irritability. Vag. Bleeding: None.  Movement: Present. Denies leaking of fluid.  ? ?The following portions of the patient's history were reviewed and updated as appropriate: allergies, current medications, past family history, past medical history, past social history, past surgical history and problem list.  ? ?Objective:  ? ?Vitals:  ? 09/22/21 1549  ?BP: 112/62  ?Pulse: 95  ?Weight: 123 lb 8 oz (56 kg)  ? ? ?Fetal Status: Fetal Heart Rate (bpm): 155 Fundal Height: 32 cm Movement: Present    ? ?General:  Alert, oriented and cooperative. Patient is in no acute distress.  ?Skin: Skin is warm and dry. No rash noted.   ?Cardiovascular: Normal heart rate noted  ?Respiratory: Normal respiratory effort, no problems with respiration noted  ?Abdomen: Soft, gravid, appropriate for gestational age. Pain/Pressure: Present     ?Pelvic:  Cervical exam deferred        ?Extremities: Normal range of motion.  Edema: None  ?Mental Status: Normal mood and affect. Normal behavior. Normal judgment and thought content.  ? ? ?Assessment and Plan:  ?Pregnancy: G2P0010 at [redacted]w[redacted]d ? ?1. Supervision of other normal pregnancy, antepartum ?Doing well with normal fetal movement.  ? ?2. [redacted] weeks gestation of pregnancy ? ?3. 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 ?4. Trichomonal vaginitis during pregnancy in first trimester ?Negative TOC. No symptoms. Should add trich to gc/ch at 36 week swabs.  ? ?Preterm labor symptoms and general obstetric precautions including but not limited to vaginal  bleeding, contractions, leaking of fluid and fetal movement were reviewed in detail with the patient. ?Please refer to After Visit Summary for other counseling recommendations.  ?Return in about 2 weeks (around 10/06/2021) for ROB. ? ? ?Allayne Stack, DO ?

## 2021-09-23 ENCOUNTER — Encounter: Payer: Medicaid Other | Admitting: Obstetrics and Gynecology

## 2021-09-24 ENCOUNTER — Ambulatory Visit: Payer: Medicaid Other | Attending: Obstetrics

## 2021-09-24 ENCOUNTER — Other Ambulatory Visit: Payer: Self-pay

## 2021-09-24 ENCOUNTER — Other Ambulatory Visit: Payer: Self-pay | Admitting: *Deleted

## 2021-09-24 ENCOUNTER — Encounter: Payer: Self-pay | Admitting: *Deleted

## 2021-09-24 ENCOUNTER — Ambulatory Visit: Payer: Medicaid Other | Admitting: *Deleted

## 2021-09-24 VITALS — BP 107/61 | HR 81

## 2021-09-24 DIAGNOSIS — O99113 Other diseases of the blood and blood-forming organs and certain disorders involving the immune mechanism complicating pregnancy, third trimester: Secondary | ICD-10-CM

## 2021-09-24 DIAGNOSIS — O365990 Maternal care for other known or suspected poor fetal growth, unspecified trimester, not applicable or unspecified: Secondary | ICD-10-CM

## 2021-09-24 DIAGNOSIS — O365930 Maternal care for other known or suspected poor fetal growth, third trimester, not applicable or unspecified: Secondary | ICD-10-CM | POA: Diagnosis not present

## 2021-09-24 DIAGNOSIS — Z3689 Encounter for other specified antenatal screening: Secondary | ICD-10-CM

## 2021-09-24 DIAGNOSIS — D563 Thalassemia minor: Secondary | ICD-10-CM | POA: Diagnosis not present

## 2021-09-24 DIAGNOSIS — Z3A32 32 weeks gestation of pregnancy: Secondary | ICD-10-CM | POA: Diagnosis not present

## 2021-10-06 NOTE — Progress Notes (Signed)
?  Subjective:  ?Cassandra Bowen is a 31 y.o. G2P0010 at [redacted]w[redacted]d being seen today for ongoing prenatal care.  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 ? ?Patient reports  no concerns .  Contractions: Not present. Vag. Bleeding: None.  Movement: Present. Denies leaking of fluid.  ? ?The following portions of the patient's history were reviewed and updated as appropriate: allergies, current medications, past family history, past medical history, past social history, past surgical history and problem list. Problem list updated. ? ?Objective:  ? ?Vitals:  ? 10/07/21 1145  ?BP: 101/65  ?Pulse: (!) 108  ?Weight: 122 lb 9.6 oz (55.6 kg)  ? ? ?Fetal Status: Fetal Heart Rate (bpm): 155 Fundal Height: 35 cm Movement: Present    ? ?General:  Alert, oriented and cooperative. Patient is in no acute distress.  ?Skin: Skin is warm and dry. No rash noted.   ?Cardiovascular: Normal heart rate noted  ?Respiratory: Normal respiratory effort, no problems with respiration noted  ?Abdomen: Soft, gravid, appropriate for gestational age. Pain/Pressure: Absent     ?Pelvic: Vag. Bleeding: None     ?Cervical exam deferred        ?Extremities: Normal range of motion.  Edema: None  ?Mental Status: Normal mood and affect. Normal behavior. Normal judgment and thought content.  ? ? ?Assessment and Plan:  ?Pregnancy: G2P0010 at [redacted]w[redacted]d ? ?1. Supervision of other normal pregnancy, antepartum ?Doing well. Fetal heart rate normal and fundal height. No acute concerns at this time. ?- follow up with 2 weeks for 36 week labs ? ?2. Trichomonal vaginitis during pregnancy, antepartum ?Plan for repeat swab at 36 week at next visit ? ?3. [redacted] weeks gestation of pregnancy ? ? ?Preterm labor symptoms and  general obstetric precautions including but not limited to vaginal bleeding, contractions, leaking of fluid and fetal movement were reviewed in detail with the patient. ?Please refer to After Visit Summary for other counseling recommendations.  ?Return in about 2 weeks (around 10/21/2021) for LROB, any provider. ? ? ?Warner Mccreedy, MD, MPH ?OB Fellow, Faculty Practice ? ? ?

## 2021-10-07 ENCOUNTER — Ambulatory Visit (INDEPENDENT_AMBULATORY_CARE_PROVIDER_SITE_OTHER): Payer: Medicaid Other | Admitting: Family Medicine

## 2021-10-07 VITALS — BP 101/65 | HR 108 | Wt 122.6 lb

## 2021-10-07 DIAGNOSIS — Z3A34 34 weeks gestation of pregnancy: Secondary | ICD-10-CM

## 2021-10-07 DIAGNOSIS — A5901 Trichomonal vulvovaginitis: Secondary | ICD-10-CM

## 2021-10-07 DIAGNOSIS — O23599 Infection of other part of genital tract in pregnancy, unspecified trimester: Secondary | ICD-10-CM

## 2021-10-07 DIAGNOSIS — Z3480 Encounter for supervision of other normal pregnancy, unspecified trimester: Secondary | ICD-10-CM

## 2021-10-07 MED ORDER — PROMETHAZINE HCL 12.5 MG PO TABS: 12.5000 mg | ORAL_TABLET | Freq: Three times a day (TID) | ORAL | 0 refills | Status: DC | PRN

## 2021-10-07 MED ORDER — FAMOTIDINE 10 MG PO TABS: 10.0000 mg | ORAL_TABLET | Freq: Two times a day (BID) | ORAL | 1 refills | Status: DC | PRN

## 2021-10-22 ENCOUNTER — Ambulatory Visit: Payer: Medicaid Other | Attending: Obstetrics

## 2021-10-22 ENCOUNTER — Encounter: Payer: Self-pay | Admitting: *Deleted

## 2021-10-22 ENCOUNTER — Ambulatory Visit: Payer: Medicaid Other | Admitting: *Deleted

## 2021-10-22 VITALS — BP 105/61 | HR 77

## 2021-10-22 DIAGNOSIS — O365930 Maternal care for other known or suspected poor fetal growth, third trimester, not applicable or unspecified: Secondary | ICD-10-CM | POA: Diagnosis not present

## 2021-10-22 DIAGNOSIS — Z3A36 36 weeks gestation of pregnancy: Secondary | ICD-10-CM

## 2021-10-22 DIAGNOSIS — O365990 Maternal care for other known or suspected poor fetal growth, unspecified trimester, not applicable or unspecified: Secondary | ICD-10-CM | POA: Diagnosis not present

## 2021-10-22 DIAGNOSIS — D563 Thalassemia minor: Secondary | ICD-10-CM | POA: Diagnosis not present

## 2021-10-22 DIAGNOSIS — O285 Abnormal chromosomal and genetic finding on antenatal screening of mother: Secondary | ICD-10-CM

## 2021-10-25 ENCOUNTER — Ambulatory Visit (INDEPENDENT_AMBULATORY_CARE_PROVIDER_SITE_OTHER): Payer: Medicaid Other | Admitting: Family Medicine

## 2021-10-25 ENCOUNTER — Other Ambulatory Visit (HOSPITAL_COMMUNITY)
Admission: RE | Admit: 2021-10-25 | Discharge: 2021-10-25 | Disposition: A | Discharge status: Home / Self Care | Payer: Medicaid Other | Source: Ambulatory Visit | Attending: Family Medicine | Admitting: Family Medicine

## 2021-10-25 VITALS — BP 107/68 | HR 95 | Wt 127.1 lb

## 2021-10-25 DIAGNOSIS — Z3480 Encounter for supervision of other normal pregnancy, unspecified trimester: Secondary | ICD-10-CM | POA: Diagnosis not present

## 2021-10-25 LAB — OB RESULTS CONSOLE GC/CHLAMYDIA: Gonorrhea: NEGATIVE

## 2021-10-25 NOTE — Progress Notes (Signed)
? ?  PRENATAL VISIT NOTE ? ?Subjective:  ?Cassandra Bowen is a 31 y.o. G2P0010 at [redacted]w[redacted]d being seen today for ongoing prenatal care.  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 ? ?Patient reports no complaints.  Contractions: Not present. Vag. Bleeding: None.  Movement: Present. Denies leaking of fluid.  ? ?The following portions of the patient's history were reviewed and updated as appropriate: allergies, current medications, past family history, past medical history, past social history, past surgical history and problem list.  ? ?Objective:  ? ?Vitals:  ? 10/25/21 1622  ?BP: 107/68  ?Pulse: 95  ?Weight: 127 lb 1.6 oz (57.7 kg)  ? ? ?Fetal Status: Fetal Heart Rate (bpm): 146 Fundal Height: 30 cm Movement: Present  Presentation: Vertex ? ?General:  Alert, oriented and cooperative. Patient is in no acute distress.  ?Skin: Skin is warm and dry. No rash noted.   ?Cardiovascular: Normal heart rate noted  ?Respiratory: Normal respiratory effort, no problems with respiration noted  ?Abdomen: Soft, gravid, appropriate for gestational age.  Pain/Pressure: Present     ?Pelvic: Cervical exam performed in the presence of a chaperone Dilation: Fingertip Effacement (%): 30    ?Extremities: Normal range of motion.  Edema: None  ?Mental Status: Normal mood and affect. Normal behavior. Normal judgment and thought content.  ? ?Assessment and Plan:  ?Pregnancy: G2P0010 at [redacted]w[redacted]d ?1. Supervision of other normal pregnancy, antepartum ?Continue routine prenatal care. ?Cultures today ?- Cervicovaginal ancillary only( [place]) ?- Culture, beta strep (group b only) ? ?Preterm labor symptoms and general obstetric precautions including but not limited to vaginal bleeding, contractions, leaking of fluid and fetal  movement were reviewed in detail with the patient. ?Please refer to After Visit Summary for other counseling recommendations.  ? ?Return in 1 week (on 11/01/2021) for [facility]. ? ?Future Appointments  ?Date Time Provider Department Center  ?11/02/2021  9:15 AM Marylene Land, CNM [facility]  ?11/09/2021 10:15 AM Hermina Staggers, MD [facility]  ? ? ?Reva Bores, MD ? ?

## 2021-10-25 NOTE — Patient Instructions (Addendum)
AREA PEDIATRIC/FAMILY PRACTICE PHYSICIANS ? ?[place] ([address]) ?[facility] ?Chambliss, MD; Eniola, MD; Hale, MD; Hensel, MD; McDiarmid, MD; McIntyer, MD; Neal, MD; Walden, MD ?[address] [place], [place] [address] ?[phone] ?Mon-Fri 8:30-12:30, 1:30-5:00 ?Providers come to see babies at [facility] ?Accepting Medicaid ?[facility] ?Limited providers who accept newborns: Koirala, MD; Morrow, MD; Wolters, MD ?[address], [place], [place] [address] ?[phone] ?Mon-Fri 8:00-5:30 ?Babies seen by providers at [facility] ?Does NOT accept Medicaid ?Please call early in hospitalization for appointment (limited availability)  ?[facility] ?Mulberry, MD ?[address] [place], [place] [address] ?[phone] ?Mon, Tue, Thur, Fri 8:30-5:00, Wed 10:00-7:00 (closed 1-2pm) ?Babies seen by [facility] providers ?Accepting Medicaid ?[facility] ?Rubin, MD ?[address], [place], [place] [address] ?[phone] ?Mon-Fri 8:30-5:00, Sat 8:30-12:00 ?Provider comes to see babies at [facility] ?Accepting Medicaid ?Must have been referred from current patients or contacted office prior to delivery ?[facility] ([facility]) ?Brown, MD; Chandler, MD; Ettefagh, MD; Grant, MD; Lester, MD; McCormick, MD; McQueen, MD; Prose, MD; Simha, MD; Stanley, MD; Stryffeler, NP; Tebben, NP ?[address], [place], [place] [address] ?[phone] ?Mon, Tue, Thur, Fri 8:30-5:30, Wed 9:30-5:30, Sat 8:30-12:30 ?Babies seen by [facility] providers ?Accepting Medicaid ?Only accepting infants of first-time parents or siblings of current patients ?Hospital discharge coordinator will make follow-up appointment ?Jack Amos ?[address], [place], [place]  [address] ?[phone]   Fax - [phone] ?[facility] ?[address], [place], [place]  [address] ?Phone - [phone]   Fax - [phone] ?Shilpa Gosrani ?[address], [place], [place]  [address] ?[phone] ? ?[place] ([address]) ?[facility] ?Bates, MD; Brassfield, MD; Cooper, Cox, MD; MD; Davis, MD; Dovico, MD; Ettefaugh, MD; Little, MD; Lowe, MD; Keiffer, MD; Melvin, MD; Sumner, MD; Williams, MD ?[address], [place], [place] [address] ?[phone] ?Mon-Fri 8:30-5:00 (extended evenings Mon-Thur as needed), Sat-Sun 10:00-1:00 ?Providers come to see babies at [facility] ?Accepting Medicaid for families of first-time babies and families with all children in the household age 3 and under. Must register with office prior to making appointment (M-F only). ?[facility] ?Henson, NP; Knapp, MD; Lalonde, MD; Tysinger, PA ?[address] [place], [place] [address] ?[phone] ?Mon-Fri 8:00-5:00 ?Babies seen by providers at [facility] ?Does NOT accept Medicaid/Commercial Insurance Only ?[facility] ([facility])  ?Artis, MD; Barnes, MD; Bratton, MD; Coccaro, MD; Lockett Gardner, MD; Kramer, MD; Marshall, MD; Netherton, MD; Poleto, MD; Skinner, MD ?[address] [place], [place] [address] ?[phone] ?Mon-Fri 8:30-5:30, Sat (Oct.-Mar.) 9:00-1:00 ?Babies seen by providers at [facility] ?Accepting Medicaid ? ?[place] ([address]) ?[facility] ?Reid, MD; Warner, MD ?[address], [place], [place] [address] ?[phone] ?Mon-Fri 8:30-5:00, Sat 8:30-12:00 ?Providers come to see babies at [facility] ?Does NOT accept Medicaid ?[facility] ?Becker, PA; Hagler, MD; Scifres, PA; Sun, MD; Swayne, MD ?[address], [place],  [address] ?[phone] ?Mon-Fri 8:00-5:00 ?Babies seen by providers at [facility] ?Does NOT accept Medicaid ?Only accepting babies of parents who  are patients ?Please call early in hospitalization for appointment (limited availability) ?[facility] ?Clark, MD; Frye, MD; Kelleher, MD; Mack, NP; Miller, MD; O'Keller, MD; Patterson, NP; Pudlo, MD; Puzio, MD; Thomas, MD; Tucker, MD; Twiselton, MD ?[address], [place], [place] [address] ?[phone] ?Mon-Fri 8:00-5:00, Sat 9:00-12:00 ?Providers come to see babies at [facility] ?Does NOT accept Medicaid ? ?[place] ([address]) ?[facility] ?Limited providers accepting new patients: Brake, NP; Wharton, PA ?[address], [place], [place] [address] ?[phone] ?Mon-Fri 8:00-5:00 ?Babies seen by providers at [facility] ?Does NOT accept Medicaid ?Only accepting babies of parents who are patients ?Please call early in hospitalization for appointment (limited availability) ?[facility] ?Gay, MD; Quinlan, MD ?[address] [place], [place] [address] ?[phone] (press [phone] to schedule appointment) ?Mon-Fri 8:00-5:00 ?Providers come to see babies at [facility] ?Does NOT accept Medicaid ?[facility] ?Mazer, MD ?[address] [place], [place] [address] ?[phone] ?Mon-Fri 8:30-5:00 (lunch 12:30-1:00), extended hours by appointment only Wed 5:00-6:30 ?Babies seen by [facility] providers ?Accepting Medicaid ?[facility] ?Banks, MD; Jordan, MD; Koberlein, MD ?[address], [place], [place] [address] ?[phone] ?Mon-Fri 8:00-5:00 ?Babies seen by [facility] providers ?Does NOT accept Medicaid ?[facility] ?Parker, MD; Hunter, MD; Wallace, DO ?[address] [place], [place] [address] ?[phone] ?Mon-Fri 8:00-5:00 ?Babies seen by [facility] providers ?Does NOT accept Medicaid ?[facility] ?Brandon, PA; Brecken, PA; Christy, NP; Dees, MD; DeClaire, MD; DeWeese, MD; Hansen, NP; Mills, NP; Parrish, NP; Smoot, NP; Summer, MD; Vapne,  MD ?[address] [place], [place] [address] ?[phone] ?Mon-Fri 8:30-5:00, Sat 10:00-1:00 ?Providers come to see babies at [facility] ?Does NOT accept Medicaid ?Free prenatal information session Tuesdays at 4:45pm ?[facility] ?Bouska, MD; Gordon, PA; Jeffery, PA; Weber, PA ?[address] [place] [address] ?[phone] ?Mon-Fri 7:30-5:30 ?Babies seen by [facility] providers ?[facility] ?[address], [place], [place]  [address] ?[phone]   Fax - [phone] ? ?[place] ([address]) ?[facility] ?Reese, MD ?[address] [place], [place] [address] ?[phone] ?Mon-Thur 8:00-6:00 ?Providers come to see babies at [facility] ?Accepting Medicaid ?[facility] ?Anderson, NP; Badger, MD; Beal, PA; Spencer, PA ?[address] [place], [place] [address] ?[phone] ?Mon-Thur 7:30-7:30, Fri 7:30-4:30 ?Babies seen by [facility] providers ?Accepting Medicaid ?[facility] ?Agbuya, MD; Klett, NP; Romgoolam, MD ?[address], [place], [place] [address] ?[phone] ?Mon-Fri 8:30-5:00, Sat 8:30-12:00 ?Providers come to see babies at [facility] ?Accepting Medicaid ?Must have ?Meet & Greet? appointment at office prior to delivery ?[facility] ([facility]) ?McCord, MD; Wallace, MD; Wood, MD ?[address], [place], [place] [address] ?[phone] ?Mon-Wed 8:00-6:00, Thur-Fri 8:00-5:00, Sat 9:00-12:00 ?Providers come to see babies at [facility] ?Does NOT accept Medicaid ?Only accepting siblings of current patients ?[facility]  ?[address], [place], [place]  [address] ?[phone]   Fax - [phone] ?[facility] ?[address], [place], [place]  [address] ?[phone]   Fax -  [phone] ? ?[place] ([address]) ?[facility] ?Cirigliano, DO; Matthews, DO ?[address] , [place] [address] ?[phone] ?Mon-Fri 7:00-5:00 ?Babies seen by [facility]

## 2021-10-26 LAB — CERVICOVAGINAL ANCILLARY ONLY
Chlamydia: NEGATIVE
Comment: NEGATIVE
Comment: NEGATIVE
Comment: NORMAL
Neisseria Gonorrhea: NEGATIVE
Trichomonas: NEGATIVE

## 2021-10-29 ENCOUNTER — Encounter: Payer: Self-pay | Admitting: Family Medicine

## 2021-10-29 LAB — CULTURE, BETA STREP (GROUP B ONLY): Strep Gp B Culture: POSITIVE — AB

## 2021-10-30 NOTE — Progress Notes (Signed)
? ?  PRENATAL VISIT NOTE ? ?Subjective:  ?Cassandra Bowen is a 31 y.o. G2P0010 at [redacted]w[redacted]d being seen today for ongoing prenatal care.  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 ? ?Patient reports  contractions and pressure.  Cassandra Bowen She would like her cervix checked.  Contractions: Not present. Vag. Bleeding: None.  Movement: Present. Denies leaking of fluid.  ? ?The following portions of the patient's history were reviewed and updated as appropriate: allergies, current medications, past family history, past medical history, past social history, past surgical history and problem list.  ? ?Objective:  ? ?Vitals:  ? 11/02/21 0929  ?BP: 115/65  ?Pulse: 84  ?Weight: 130 lb (59 kg)  ? ? ?Fetal Status: Fetal Heart Rate (bpm): 144 Fundal Height: 35 cm Movement: Present    ? ?General:  Alert, oriented and cooperative. Patient is in no acute distress.  ?Skin: Skin is warm and dry. No rash noted.   ?Cardiovascular: Normal heart rate noted  ?Respiratory: Normal respiratory effort, no problems with respiration noted  ?Abdomen: Soft, gravid, appropriate for gestational age.  Pain/Pressure: Absent     ?Pelvic: Cervical exam performed in the presence of a chaperone        ?Extremities: Normal range of motion.     ?Mental Status: Normal mood and affect. Normal behavior. Normal judgment and thought content.  ? ?Assessment and Plan:  ?Pregnancy: G2P0010 at [redacted]w[redacted]d ?1. [redacted] weeks gestation of pregnancy   ?2. Supervision of other normal pregnancy, antepartum   ? ?-patient doing well; she confirms that she would like to have a mirena ?-reviewed signs of labor and the 5-1-1 rule ?-FH measuring slightly small but [place] 9 days ago showed normal fetal growth ?-discussed  plan for IOL between 40-41 weeks ? ?Term labor symptoms and general obstetric precautions including but not limited to vaginal bleeding, contractions, leaking of fluid and fetal movement were reviewed in detail with the patient. ?Please refer to After Visit Summary for other counseling recommendations.  ? ?No follow-ups on file. ? ?Future Appointments  ?Date Time Provider Department Center  ?11/09/2021 10:15 AM Hermina Staggers, MD [facility]  ? ? ?Cassandra Bowen, CNM ? ?

## 2021-11-01 ENCOUNTER — Encounter: Payer: Self-pay | Admitting: Family Medicine

## 2021-11-01 DIAGNOSIS — O99820 Streptococcus B carrier state complicating pregnancy: Secondary | ICD-10-CM | POA: Insufficient documentation

## 2021-11-02 ENCOUNTER — Ambulatory Visit (INDEPENDENT_AMBULATORY_CARE_PROVIDER_SITE_OTHER): Payer: Medicaid Other | Admitting: Student

## 2021-11-02 VITALS — BP 115/65 | HR 84 | Wt 130.0 lb

## 2021-11-02 DIAGNOSIS — Z3480 Encounter for supervision of other normal pregnancy, unspecified trimester: Secondary | ICD-10-CM

## 2021-11-02 DIAGNOSIS — Z3A37 37 weeks gestation of pregnancy: Secondary | ICD-10-CM

## 2021-11-02 NOTE — Progress Notes (Signed)
Patient her for routine prenatal care. She does not have any questions or concerns but would like her cervix checked today ?

## 2021-11-02 NOTE — Patient Instructions (Signed)
Remmeber: come to [facility] if contractions are 5 minutes apart, lasting a minute each, for 1 hour.  ?

## 2021-11-09 ENCOUNTER — Telehealth (HOSPITAL_COMMUNITY): Payer: Self-pay | Admitting: *Deleted

## 2021-11-09 ENCOUNTER — Encounter: Payer: Self-pay | Admitting: Obstetrics and Gynecology

## 2021-11-09 ENCOUNTER — Encounter (HOSPITAL_COMMUNITY): Payer: Self-pay | Admitting: *Deleted

## 2021-11-09 ENCOUNTER — Ambulatory Visit (INDEPENDENT_AMBULATORY_CARE_PROVIDER_SITE_OTHER): Payer: Medicaid Other | Admitting: Obstetrics and Gynecology

## 2021-11-09 VITALS — BP 112/73 | HR 87 | Wt 131.7 lb

## 2021-11-09 DIAGNOSIS — O99820 Streptococcus B carrier state complicating pregnancy: Secondary | ICD-10-CM

## 2021-11-09 DIAGNOSIS — D563 Thalassemia minor: Secondary | ICD-10-CM

## 2021-11-09 DIAGNOSIS — Z3480 Encounter for supervision of other normal pregnancy, unspecified trimester: Secondary | ICD-10-CM

## 2021-11-09 NOTE — Telephone Encounter (Signed)
Preadmission screen  

## 2021-11-09 NOTE — Progress Notes (Signed)
Subjective:  ?Cassandra Bowen is a 31 y.o. G2P0010 at [redacted]w[redacted]d being seen today for ongoing prenatal care.  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 ? ?Patient reports general discomforts of pregnancy.  Contractions: Irritability. Vag. Bleeding: None.  Movement: Present. Denies leaking of fluid.  ? ?The following portions of the patient's history were reviewed and updated as appropriate: allergies, current medications, past family history, past medical history, past social history, past surgical history and problem list. Problem list updated. ? ?Objective:  ? ?Vitals:  ? 11/09/21 1003  ?BP: 112/73  ?Pulse: 87  ?Weight: 131 lb 11.2 oz (59.7 kg)  ? ? ?Fetal Status: Fetal Heart Rate (bpm): 158   Movement: Present    ? ?General:  Alert, oriented and cooperative. Patient is in no acute distress.  ?Skin: Skin is warm and dry. No rash noted.   ?Cardiovascular: Normal heart rate noted  ?Respiratory: Normal respiratory effort, no problems with respiration noted  ?Abdomen: Soft, gravid, appropriate for gestational age. Pain/Pressure: Present     ?Pelvic:  Cervical exam performed        ?Extremities: Normal range of motion.  Edema: None  ?Mental Status: Normal mood and affect. Normal behavior. Normal judgment and thought content.  ? ?Urinalysis:     ? ?Assessment and Plan:  ?Pregnancy: G2P0010 at [redacted]w[redacted]d ? ?1. Supervision of other normal pregnancy, antepartum ?Stable ?Labor Precautions ?IOL scheduled ? ?2. Group B Streptococcus carrier, +RV culture, currently pregnant ?Tx while in labor ? ?3. Alpha thalassemia silent carrier ?Stable ? ?Term labor symptoms and general obstetric precautions including but not limited to vaginal bleeding, contractions, leaking of fluid and fetal  movement were reviewed in detail with the patient. ?Please refer to After Visit Summary for other counseling recommendations.  ?Return in about 1 week (around 11/16/2021) for OB visit, face to face, any provider. ? ? ?Hermina Staggers, MD ?

## 2021-11-09 NOTE — Patient Instructions (Signed)
Vaginal Delivery  Vaginal delivery means that you give birth by pushing your baby out of your birth canal (vagina). Your health care team will help you before, during, and after vaginal delivery. Birth experiences are unique for every woman and every pregnancy, and birth experiences vary depending on where you choose to give birth. What are the risks and benefits? Generally, this is safe. However, problems may occur, including: Bleeding. Infection. Damage to other structures such as vaginal tearing. Allergic reactions to medicines. Despite the risks, benefits of vaginal delivery include less risk of bleeding and infection and a shorter recovery time compared to a Cesarean delivery. Cesarean delivery, or C-section, is the surgical delivery of a baby. What happens when I arrive at the birth center or hospital? Once you are in labor and have been admitted into the hospital or birth center, your health care team may: Review your pregnancy history and any concerns that you have. Talk with you about your birth plan and discuss pain control options. Check your blood pressure, breathing, and heartbeat. Assess your baby's heartbeat. Monitor your uterus for contractions. Check whether your bag of water (amniotic sac) has broken (ruptured). Insert an IV into one of your veins. This may be used to give you fluids and medicines. Monitoring Your health care team may assess your contractions (uterine monitoring) and your baby's heart rate (fetal monitoring). You may need to be monitored: Often, but not continuously (intermittently). All the time or for long periods at a time (continuously). Continuous monitoring may be needed if: You are taking certain medicines, such as medicine to relieve pain or make your contractions stronger. You have pregnancy or labor complications. Monitoring may be done by: Placing a special stethoscope or a handheld monitoring device on your abdomen to check your baby's  heartbeat and to check for contractions. Placing monitors on your abdomen (external monitors) to record your baby's heartbeat and the frequency and length of contractions. Placing monitors inside your uterus through your vagina (internal monitors) to record your baby's heartbeat and the frequency, length, and strength of your contractions. Depending on the type of monitor, it may remain in your uterus or on your baby's head until birth. Telemetry. This is a type of continuous monitoring that can be done with external or internal monitors. Instead of having to stay in bed, you are able to move around. Physical exam Your health care team may perform frequent physical exams. This may include: Checking how and where your baby is positioned in your uterus. Checking your cervix to determine: Whether it is thinning out (effacing). Whether it is opening up (dilating). What happens during labor and delivery?  Normal labor and delivery is divided into the following three stages: Stage 1 This is the longest stage of labor. Throughout this stage, you will feel contractions. Contractions generally feel mild, infrequent, and irregular at first. They get stronger, more frequent, and more regular as you move through this stage. You may have contractions about every 2-3 minutes. This stage ends when your cervix is completely dilated to 4 inches (10 cm) and completely effaced. Stage 2 This stage starts once your cervix is completely effaced and dilated and lasts until the delivery of your baby. This is the stage where you will feel an urge to push your baby out of your vagina. You may feel stretching and burning pain, especially when the widest part of your baby's head passes through the vaginal opening (crowning). Once your baby is delivered, the umbilical cord will be   clamped and cut. Timing of cutting the cord will depend on your wishes, your baby's health, and your health care provider's practices. Your baby  will be placed on your bare chest (skin-to-skin contact) in an upright position and covered with a warm blanket. If you are choosing to breastfeed, watch your baby for feeding cues, like rooting or sucking, and help the baby to your breast for his or her first feeding. Stage 3 This stage starts immediately after the birth of your baby and ends after you deliver the placenta. This stage may take anywhere from 5 to 30 minutes. After your baby has been delivered, you will feel contractions as your body expels the placenta. These contractions also help your uterus get smaller and reduce bleeding. What can I expect after labor and delivery? After labor is over, you and your baby will be assessed closely until you are ready to go home. Your health care team will teach you how to care for yourself and your baby. You and your baby may be encouraged to stay in the same room (rooming in) during your hospital stay. This will help promote early bonding and successful breastfeeding. Your uterus will be checked and massaged regularly (fundal massage). You may continue to receive fluids and medicines through an IV. You will have some soreness and pain in your abdomen, vagina, and the area of skin between your vaginal opening and your anus (perineum). If an incision was made near your vagina (episiotomy) or if you had some vaginal tearing during delivery, cold compresses may be placed on your episiotomy or your tear. This helps to reduce pain and swelling. It is normal to have vaginal bleeding after delivery. Wear a sanitary pad for vaginal bleeding and discharge. Summary Vaginal delivery means that you will give birth by pushing your baby out of your birth canal (vagina). Your health care team will monitor you and your baby throughout the stages of labor. After you deliver your baby, your health care team will continue to assess you and your baby to ensure you are both recovering as expected after delivery. This  information is not intended to replace advice given to you by your health care provider. Make sure you discuss any questions you have with your health care provider. Document Revised: 05/25/2020 Document Reviewed: 05/25/2020 Elsevier Patient Education  2023 [employer] Inc.  

## 2021-11-16 ENCOUNTER — Ambulatory Visit (INDEPENDENT_AMBULATORY_CARE_PROVIDER_SITE_OTHER): Payer: Medicaid Other | Admitting: Family Medicine

## 2021-11-16 ENCOUNTER — Encounter: Payer: Self-pay | Admitting: Family Medicine

## 2021-11-16 ENCOUNTER — Other Ambulatory Visit: Payer: Self-pay | Admitting: Women's Health

## 2021-11-16 VITALS — BP 103/65 | HR 78 | Wt 130.1 lb

## 2021-11-16 DIAGNOSIS — D563 Thalassemia minor: Secondary | ICD-10-CM

## 2021-11-16 DIAGNOSIS — Z9081 Acquired absence of spleen: Secondary | ICD-10-CM

## 2021-11-16 DIAGNOSIS — O99820 Streptococcus B carrier state complicating pregnancy: Secondary | ICD-10-CM

## 2021-11-16 DIAGNOSIS — Z3480 Encounter for supervision of other normal pregnancy, unspecified trimester: Secondary | ICD-10-CM

## 2021-11-16 NOTE — Patient Instructions (Signed)

## 2021-11-16 NOTE — Progress Notes (Signed)
? ?  Subjective:  ?Cassandra Bowen is a 31 y.o. G2P0010 at [redacted]w[redacted]d being seen today for ongoing prenatal care.  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 ? ?Patient reports no complaints.  Contractions: Irritability. Vag. Bleeding: None.  Movement: Present. Denies leaking of fluid.  ? ?The following portions of the patient's history were reviewed and updated as appropriate: allergies, current medications, past family history, past medical history, past social history, past surgical history and problem list. Problem list updated. ? ?Objective:  ? ?Vitals:  ? 11/16/21 1018  ?BP: 103/65  ?Pulse: 78  ?Weight: 130 lb 1.6 oz (59 kg)  ? ? ?Fetal Status: Fetal Heart Rate (bpm): 143   Movement: Present    ? ?General:  Alert, oriented and cooperative. Patient is in no acute distress.  ?Skin: Skin is warm and dry. No rash noted.   ?Cardiovascular: Normal heart rate noted  ?Respiratory: Normal respiratory effort, no problems with respiration noted  ?Abdomen: Soft, gravid, appropriate for gestational age. Pain/Pressure: Absent     ?Pelvic: Vag. Bleeding: None     ?Cervical exam deferred        ?Extremities: Normal range of motion.  Edema: None  ?Mental Status: Normal mood and affect. Normal behavior. Normal judgment and thought content.  ? ?Urinalysis:     ? ?Assessment and Plan:  ?Pregnancy: G2P0010 at [redacted]w[redacted]d ? ?1. Supervision of other normal pregnancy, antepartum ?BP and FHR normal ? ?2. Group B Streptococcus carrier, +RV culture, currently pregnant ?Ppx in labor ? ?3. 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 ? ?4. Alpha thalassemia silent carrier ? ? ?Term labor symptoms and general obstetric precautions including but not limited to vaginal bleeding, contractions, leaking of fluid  and fetal movement were reviewed in detail with the patient. ?Please refer to After Visit Summary for other counseling recommendations.  ?Return in 6 weeks (on 12/28/2021) for PP check. ? ? ?Venora Maples, MD ? ?

## 2021-11-17 ENCOUNTER — Inpatient Hospital Stay (HOSPITAL_COMMUNITY)
Admission: AD | Admit: 2021-11-17 | Discharge: 2021-11-20 | DRG: 807 | Disposition: A | Discharge status: Home / Self Care | Payer: Medicaid Other | Attending: Obstetrics & Gynecology | Admitting: Obstetrics & Gynecology

## 2021-11-17 ENCOUNTER — Encounter (HOSPITAL_COMMUNITY): Payer: Self-pay | Admitting: Obstetrics and Gynecology

## 2021-11-17 ENCOUNTER — Other Ambulatory Visit: Payer: Self-pay

## 2021-11-17 ENCOUNTER — Inpatient Hospital Stay (HOSPITAL_COMMUNITY): Payer: Medicaid Other

## 2021-11-17 DIAGNOSIS — O99820 Streptococcus B carrier state complicating pregnancy: Secondary | ICD-10-CM

## 2021-11-17 DIAGNOSIS — O480 Post-term pregnancy: Principal | ICD-10-CM | POA: Diagnosis present

## 2021-11-17 DIAGNOSIS — Z30430 Encounter for insertion of intrauterine contraceptive device: Secondary | ICD-10-CM

## 2021-11-17 DIAGNOSIS — D560 Alpha thalassemia: Secondary | ICD-10-CM | POA: Diagnosis not present

## 2021-11-17 DIAGNOSIS — O9912 Other diseases of the blood and blood-forming organs and certain disorders involving the immune mechanism complicating childbirth: Secondary | ICD-10-CM | POA: Diagnosis not present

## 2021-11-17 DIAGNOSIS — O99824 Streptococcus B carrier state complicating childbirth: Secondary | ICD-10-CM | POA: Diagnosis not present

## 2021-11-17 DIAGNOSIS — Z975 Presence of (intrauterine) contraceptive device: Secondary | ICD-10-CM

## 2021-11-17 DIAGNOSIS — D563 Thalassemia minor: Secondary | ICD-10-CM | POA: Diagnosis present

## 2021-11-17 DIAGNOSIS — Z9081 Acquired absence of spleen: Secondary | ICD-10-CM | POA: Diagnosis present

## 2021-11-17 DIAGNOSIS — Z3A40 40 weeks gestation of pregnancy: Secondary | ICD-10-CM

## 2021-11-17 DIAGNOSIS — Z3480 Encounter for supervision of other normal pregnancy, unspecified trimester: Principal | ICD-10-CM

## 2021-11-17 LAB — CBC
HCT: 37.7 % (ref 36.0–46.0)
Hemoglobin: 12.4 g/dL (ref 12.0–15.0)
MCH: 29.2 pg (ref 26.0–34.0)
MCHC: 32.9 g/dL (ref 30.0–36.0)
MCV: 88.9 fL (ref 80.0–100.0)
Platelets: 384 10*3/uL (ref 150–400)
RBC: 4.24 MIL/uL (ref 3.87–5.11)
RDW: 15 % (ref 11.5–15.5)
WBC: 7.4 10*3/uL (ref 4.0–10.5)
nRBC: 0 % (ref 0.0–0.2)

## 2021-11-17 LAB — TYPE AND SCREEN
ABO/RH(D): O POS
Antibody Screen: NEGATIVE

## 2021-11-17 LAB — RPR: RPR Ser Ql: NONREACTIVE

## 2021-11-17 MED ORDER — ONDANSETRON HCL 4 MG/2ML IJ SOLN
4.0000 mg | Freq: Four times a day (QID) | INTRAMUSCULAR | Status: DC | PRN
  Administered 2021-11-18 (×2): 4 mg via INTRAVENOUS
  Filled 2021-11-17 (×2): qty 2

## 2021-11-17 MED ORDER — PENICILLIN G POT IN DEXTROSE 60000 UNIT/ML IV SOLN
3.0000 10*6.[IU] | INTRAVENOUS | Status: DC
  Administered 2021-11-17 – 2021-11-18 (×7): 3 10*6.[IU] via INTRAVENOUS
  Filled 2021-11-17 (×7): qty 50

## 2021-11-17 MED ORDER — TERBUTALINE SULFATE 1 MG/ML IJ SOLN: 0.2500 mg | Freq: Once | INTRAMUSCULAR | Status: DC | PRN

## 2021-11-17 MED ORDER — OXYTOCIN-SODIUM CHLORIDE 30-0.9 UT/500ML-% IV SOLN
1.0000 m[IU]/min | INTRAVENOUS | Status: DC
  Administered 2021-11-17: 2 m[IU]/min via INTRAVENOUS
  Administered 2021-11-18: 6 m[IU]/min via INTRAVENOUS
  Administered 2021-11-18: 8 m[IU]/min via INTRAVENOUS

## 2021-11-17 MED ORDER — FENTANYL CITRATE (PF) 100 MCG/2ML IJ SOLN
50.0000 ug | INTRAMUSCULAR | Status: DC | PRN
  Administered 2021-11-17: 50 ug via INTRAVENOUS
  Administered 2021-11-17 (×2): 100 ug via INTRAVENOUS
  Filled 2021-11-17 (×4): qty 2

## 2021-11-17 MED ORDER — LACTATED RINGERS IV SOLN: INTRAVENOUS | Status: DC

## 2021-11-17 MED ORDER — LIDOCAINE HCL (PF) 1 % IJ SOLN
30.0000 mL | INTRAMUSCULAR | Status: DC | PRN
Start: 2021-11-17 — End: 2021-11-18

## 2021-11-17 MED ORDER — OXYTOCIN BOLUS FROM INFUSION
333.0000 mL | Freq: Once | INTRAVENOUS | Status: AC
Start: 2021-11-17 — End: 2021-11-18
  Administered 2021-11-18: 333 mL via INTRAVENOUS

## 2021-11-17 MED ORDER — OXYTOCIN-SODIUM CHLORIDE 30-0.9 UT/500ML-% IV SOLN
2.5000 [IU]/h | INTRAVENOUS | Status: DC
  Administered 2021-11-18: 2.5 [IU]/h via INTRAVENOUS
  Filled 2021-11-17: qty 500

## 2021-11-17 MED ORDER — MISOPROSTOL 50MCG HALF TABLET
50.0000 ug | ORAL_TABLET | ORAL | Status: DC
  Administered 2021-11-17: 50 ug via BUCCAL
  Filled 2021-11-17 (×3): qty 1

## 2021-11-17 MED ORDER — SOD CITRATE-CITRIC ACID 500-334 MG/5ML PO SOLN
30.0000 mL | ORAL | Status: DC | PRN
  Administered 2021-11-18: 30 mL via ORAL
  Filled 2021-11-17: qty 30

## 2021-11-17 MED ORDER — ACETAMINOPHEN 325 MG PO TABS: 650.0000 mg | ORAL_TABLET | ORAL | Status: DC | PRN

## 2021-11-17 MED ORDER — OXYCODONE-ACETAMINOPHEN 5-325 MG PO TABS: 2.0000 | ORAL_TABLET | ORAL | Status: DC | PRN

## 2021-11-17 MED ORDER — OXYCODONE-ACETAMINOPHEN 5-325 MG PO TABS: 1.0000 | ORAL_TABLET | ORAL | Status: DC | PRN

## 2021-11-17 MED ORDER — LACTATED RINGERS IV SOLN
500.0000 mL | INTRAVENOUS | Status: DC | PRN
  Administered 2021-11-17 – 2021-11-18 (×2): 500 mL via INTRAVENOUS

## 2021-11-17 MED ORDER — SODIUM CHLORIDE 0.9 % IV SOLN
5.0000 10*6.[IU] | Freq: Once | INTRAVENOUS | Status: AC
  Administered 2021-11-17: 5 10*6.[IU] via INTRAVENOUS
  Filled 2021-11-17: qty 5

## 2021-11-17 NOTE — Progress Notes (Signed)
IV team at bedside 

## 2021-11-17 NOTE — Progress Notes (Signed)
Labor Progress Note ?Cassandra Bowen is a 31 y.o. G2P0010 at [redacted]w[redacted]d presented for eIOL at term ?S: Patient is doing well and endorses discomfort with contractions. ? ?O:  ?BP 109/69   Pulse 80   Resp 16   Ht 5\' 1"  (1.549 m)   Wt 59.8 kg   LMP 02/10/2021 (Exact Date)   BMI 24.92 kg/m?  ?EFM: 120 bpm/ moderate variability /+accels, no decels ? ?CVE: Dilation: 3 ?Effacement (%): 60 ?Cervical Position: Posterior ?Station: -3 ?Presentation: Vertex ?Exam by:: [email or web address] CNM ? ? ?A&P: 31 y.o. G2P0010 [redacted]w[redacted]d  ?#Labor: Progressing well. Cervix has progressed to 3 cm dilated and FB still in place. Will start patient on pitocin and titrate up with plan to reassess in 3-4 hour or earlier if FB come off. ?#Pain: IV PRN, Epidural on  ?#FWB: Cat 1  ?#GBS positive > PCN  ? ? ?[redacted]w[redacted]d, MD ?[facility] ?5:30 PM ? ?
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
## 2021-11-17 NOTE — Progress Notes (Signed)
Cassandra Bowen is a 31 y.o. G2P0010 at [redacted]w[redacted]d by LMP admitted for induction of labor due to Elective at term. ? ?Subjective: ?Pt feeling regular contractions. Family in room for support.  ? ?Objective: ?BP 103/68   Pulse 79   Resp 16   Ht 5\' 1"  (1.549 m)   Wt 59.8 kg   LMP 02/10/2021 (Exact Date)   BMI 24.92 kg/m?  ?No intake/output data recorded. ?No intake/output data recorded. ? ?FHT:  FHR: 135 bpm, variability: moderate,  accelerations:  Present,  decelerations:  Absent ?UC:   irregular, every 2-5 minutes ?SVE:   Dilation: 1 ?Effacement (%): 60 ?Station: -3 ?Exam by:: [email or web address] CNM ?Foley balloon placed at 1355, pt tolerated well.   ? ? ?Labs: ?Lab Results  ?Component Value Date  ? WBC 7.4 11/17/2021  ? HGB 12.4 11/17/2021  ? HCT 37.7 11/17/2021  ? MCV 88.9 11/17/2021  ? PLT 384 11/17/2021  ? ? ?Assessment / Plan: ?Induction of labor due to elective at 40 weeks ?S/P Cytotec x 1 ?Foley in place ? ?Labor: Progressing normally ?Preeclampsia:   n/a ?Fetal Wellbeing:  Category I ?Pain Control:  Labor support without medications ?I/D:   GBS pos on PCN ?Anticipated MOD:  NSVD ? ?01/17/2022 [place] ?11/17/2021, 3:04 PM ? ? ?

## 2021-11-18 ENCOUNTER — Encounter (HOSPITAL_COMMUNITY): Payer: Self-pay | Admitting: Obstetrics and Gynecology

## 2021-11-18 ENCOUNTER — Inpatient Hospital Stay (HOSPITAL_COMMUNITY): Payer: Medicaid Other | Admitting: Anesthesiology

## 2021-11-18 DIAGNOSIS — Z30430 Encounter for insertion of intrauterine contraceptive device: Secondary | ICD-10-CM

## 2021-11-18 DIAGNOSIS — Z975 Presence of (intrauterine) contraceptive device: Secondary | ICD-10-CM

## 2021-11-18 DIAGNOSIS — O480 Post-term pregnancy: Secondary | ICD-10-CM

## 2021-11-18 DIAGNOSIS — O99824 Streptococcus B carrier state complicating childbirth: Secondary | ICD-10-CM

## 2021-11-18 DIAGNOSIS — Z3A40 40 weeks gestation of pregnancy: Secondary | ICD-10-CM

## 2021-11-18 MED ORDER — PHENYLEPHRINE 80 MCG/ML (10ML) SYRINGE FOR IV PUSH (FOR BLOOD PRESSURE SUPPORT)
80.0000 ug | PREFILLED_SYRINGE | INTRAVENOUS | Status: AC | PRN
  Administered 2021-11-18 (×3): 80 ug via INTRAVENOUS

## 2021-11-18 MED ORDER — EPHEDRINE 5 MG/ML INJ: 10.0000 mg | INTRAVENOUS | Status: DC | PRN

## 2021-11-18 MED ORDER — PHENYLEPHRINE 80 MCG/ML (10ML) SYRINGE FOR IV PUSH (FOR BLOOD PRESSURE SUPPORT)
80.0000 ug | PREFILLED_SYRINGE | INTRAVENOUS | Status: DC | PRN
  Administered 2021-11-18: 80 ug via INTRAVENOUS
  Filled 2021-11-18: qty 10

## 2021-11-18 MED ORDER — LEVONORGESTREL 20 MCG/DAY IU IUD
1.0000 | INTRAUTERINE_SYSTEM | Freq: Once | INTRAUTERINE | Status: AC
  Administered 2021-11-18: 1 via INTRAUTERINE
  Filled 2021-11-18: qty 1

## 2021-11-18 MED ORDER — LIDOCAINE HCL (PF) 1 % IJ SOLN
INTRAMUSCULAR | Status: DC | PRN
Start: 2021-11-18 — End: 2021-11-18
  Administered 2021-11-18: 5 mL via EPIDURAL
  Administered 2021-11-18: 4 mL via EPIDURAL

## 2021-11-18 MED ORDER — FENTANYL-BUPIVACAINE-NACL 0.5-0.125-0.9 MG/250ML-% EP SOLN
12.0000 mL/h | EPIDURAL | Status: DC | PRN
  Administered 2021-11-18: 12 mL/h via EPIDURAL
  Administered 2021-11-18: 11 mL/h via EPIDURAL
  Filled 2021-11-18 (×2): qty 250

## 2021-11-18 MED ORDER — LACTATED RINGERS IV SOLN
500.0000 mL | Freq: Once | INTRAVENOUS | Status: AC
  Administered 2021-11-18: 500 mL via INTRAVENOUS

## 2021-11-18 MED ORDER — DIPHENHYDRAMINE HCL 50 MG/ML IJ SOLN: 12.5000 mg | INTRAMUSCULAR | Status: DC | PRN

## 2021-11-18 NOTE — Progress Notes (Signed)
Cassandra Bowen is a 31 y.o. G2P0010 at [redacted]w[redacted]d admitted for induction of labor due to Elective at term/post dates. ? ?Subjective: ?Doing well. Feels comfortable with epidural. Ok with check and IUPC if needed ? ?Objective: ?BP 93/68  Pulse 79   Temp 98.3 ?F (36.8 ?C) (Oral)   Resp 16   Ht 5\' 1"  (1.549 m)   Wt 59.8 kg   LMP 02/10/2021 (Exact Date)   SpO2 98%   BMI 24.92 kg/m?  ?No intake/output data recorded. ?No intake/output data recorded. ? ?FHT:  FHR: 120 bpm, variability: moderate,  accelerations:  Present,  decelerations:  Present intermittent lates ?UC:   regular, every 2-4 minutes ?SVE:   Dilation: 4.5 ?Effacement (%): 80 ?Station: -1 ?Exam by:: Dr. [email or web address] ? ?Labs: ?Lab Results  ?Component Value Date  ? WBC 7.4 11/17/2021  ? HGB 12.4 11/17/2021  ? HCT 37.7 11/17/2021  ? MCV 88.9 11/17/2021  ? PLT 384 11/17/2021  ? ? ?Assessment / Plan: ?G2P0010 at [redacted]w[redacted]d admitted for induction of labor due to Elective at term/post dates. ? ?Labor:  Patient on pitocin since 1735 (briefly off for decels). Current cervix still about ~4 cm after AROM at 0405. IUPC placed during this check and will continue uptitrating pitocin as appropriate.  Intermittent late decels that improved with positioning and fluid bolus.  ? ?Fetal Wellbeing:  Category II with moderate variability  ?Pain Control:  Epidural ?I/D:   GBS pos>PCN  ? ?[id number], MD, MPH ?OB Fellow, Faculty Practice ? ? ? ?

## 2021-11-18 NOTE — Anesthesia Procedure Notes (Signed)
Epidural ?Patient location during procedure: OB ?Start time: 11/18/2021 3:01 AM ?End time: 11/18/2021 3:10 AM ? ?Staffing ?Anesthesiologist: Josephine Igo, MD ?Performed: anesthesiologist  ? ?Preanesthetic Checklist ?Completed: patient identified, IV checked, site marked, risks and benefits discussed, surgical consent, monitors and equipment checked, pre-op evaluation and timeout performed ? ?Epidural ?Patient position: sitting ?Prep: DuraPrep and site prepped and draped ?Patient monitoring: continuous pulse ox and blood pressure ?Approach: midline ?Location: L3-L4 ?Injection technique: LOR air ? ?Needle:  ?Needle type: Tuohy  ?Needle gauge: 17 G ?Needle length: 9 cm and 9 ?Needle insertion depth: 5 cm ?Catheter type: closed end flexible ?Catheter size: 19 Gauge ?Catheter at skin depth: 10 cm ?Test dose: negative and Other ? ?Assessment ?Events: blood not aspirated, injection not painful, no injection resistance, no paresthesia and negative IV test ? ?Additional Notes ?Patient identified. Risks and benefits discussed including failed block, incomplete  ?Pain control, post dural puncture headache, nerve damage, paralysis, blood pressure ?Changes, nausea, vomiting, reactions to medications-both toxic and allergic and post ?Partum back pain. All questions were answered. Patient expressed understanding and wished to proceed. Sterile technique was used throughout procedure. Epidural site was ?Dressed with sterile barrier dressing. No paresthesias, signs of intravascular injection ?Or signs of intrathecal spread were encountered.  ?Patient was more comfortable after the epidural was dosed. ?Please see RN's note for documentation of vital signs and FHR which are stable. ?Reason for block:procedure for pain ? ? ? ?

## 2021-11-18 NOTE — Discharge Summary (Signed)
? ?  Postpartum Discharge Summary ? ?Date of Service updated ? ?   ?Patient Name: Cassandra Bowen ?DOB: 02-May-1991 ?MRN: [id number] ? ?Date of admission: 11/17/2021 ?Delivery date:11/18/2021  ?Delivering provider: Christin Fudge  ?Date of discharge: 11/20/2021 ? ?Admitting diagnosis: Post-dates pregnancy [O48.0] ?Intrauterine pregnancy: [redacted]w[redacted]d    ?Secondary diagnosis:  Principal Problem: ?  Post-dates pregnancy ?Active Problems: ?  Alpha thalassemia silent carrier ?  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  Group B Streptococcus carrier, +RV culture, currently pregnant ?  Encounter for insertion of intrauterine contraceptive device (IUD) ?  IUD (intrauterine device) in place ? ?Additional problems: None    ?Discharge diagnosis: Term Pregnancy Delivered                                              ?Post partum procedures: IUD placement     ?Augmentation: AROM, Pitocin, Cytotec, and IP Foley ?Complications: None ? ?Hospital course: Induction of Labor With Vaginal Delivery   ?31y.o. yo G2P0010 at 428w1das admitted to the hospital 11/17/2021 for induction of labor.  Indication for induction: Elective.  Patient had an uncomplicated labor course as follows: ?Membrane Rupture Time/Date: 4:07 AM ,11/18/2021   ?Delivery Method:Vaginal, Spontaneous  ?Episiotomy: None  ?Lacerations:  1st degree  ?Details of delivery can be found in separate delivery note.  Patient had a routine postpartum course. Patient is discharged home 11/20/21. ? ?Newborn Data: ?Birth date:11/18/2021  ?Birth time:10:01 PM  ?Gender:Female  ?Living status:Living  ?Apgars:8 ,9  ?Weight:2807 g  ? ?Magnesium Sulfate received: No ?BMZ received: No ?Rhophylac:N/A ?MMR:N/A ?T-DaP: declined ?Flu: N/A ?Transfusion:No ? ?Physical exam  ?Vitals:  ? 11/19/21 0903 11/19/21 1430 11/19/21 2128 11/20/21 0622  ?BP: 106/72 111/78 118/60 107/84  ?Pulse: 77 71 71 88  ?Resp: '17 18 16 16  ' ?Temp: 98 ?F (36.7 ?C) 98.1 ?F (36.7 ?C) 98.2 ?F (36.8 ?C) 98.4 ?F (36.9 ?C)  ?TempSrc:  Oral Oral Oral Oral  ?SpO2:   99%   ?Weight:      ?Height:      ? ?General: alert, cooperative, and no distress ?Lochia: appropriate ?Uterine Fundus: firm ?Incision: N/A ?DVT Evaluation: minimal edema bilaterally  ?Labs: ?Lab Results  ?Component Value Date  ? WBC 15.3 (H) 11/20/2021  ? HGB 8.9 (L) 11/20/2021  ? HCT 27.5 (L) 11/20/2021  ? MCV 89.9 11/20/2021  ? PLT 284 11/20/2021  ? ? ?  Latest Ref Rng & Units 07/07/2019  ?  4:23 AM  ?CMP  ?Glucose 70 - 99 mg/dL 88    ?BUN 6 - 20 mg/dL 9    ?Creatinine 0.44 - 1.00 mg/dL 0.59    ?Sodium 135 - 145 mmol/L 135    ?Potassium 3.5 - 5.1 mmol/L 3.8    ?Chloride 98 - 111 mmol/L 103    ?CO2 22 - 32 mmol/L 26    ?Calcium 8.9 - 10.3 mg/dL 8.5    ? ?Edinburgh Score: ? ?  11/19/2021  ?  4:55 PM  ?Edinburgh Postnatal Depression Scale Screening Tool  ?I have been able to laugh and see the funny side of things. 0  ?I have looked forward with enjoyment to things. 0  ?I have blamed myself unnecessarily when things went wrong. 2  ?I have been anxious or worried for no good reason. 0  ?I have felt scared or panicky for no good reason. 0  ?  Things have been getting on top of me. 0  ?I have been so unhappy that I have had difficulty sleeping. 0  ?I have felt sad or miserable. 0  ?I have been so unhappy that I have been crying. 0  ?The thought of harming myself has occurred to me. 0  ?Edinburgh Postnatal Depression Scale Total 2  ? ? ? ?After visit meds:  ?Allergies as of 11/20/2021   ?No Known Allergies ?  ? ?  ?Medication List  ?  ? ?STOP taking these medications   ? ?Blood Pressure Monitoring Devi ?  ? ?  ? ?TAKE these medications   ? ?acetaminophen 325 MG tablet ?Commonly known as: Tylenol ?Take 2 tablets (650 mg total) by mouth every 4 (four) hours as needed (for pain scale < 4). ?  ?ferrous sulfate 325 (65 FE) MG tablet ?Take 1 tablet (325 mg total) by mouth every other day. ?Start taking on: Nov 21, 2021 ?  ?ibuprofen 600 MG tablet ?Commonly known as: ADVIL ?Take 1 tablet (600 mg total)  by mouth every 6 (six) hours as needed. ?  ?M-Natal Plus 27-1 MG Tabs ?Take 1 tablet by mouth daily. ?  ? ?  ? ? ? ?Discharge home in stable condition ?Infant Feeding: Breast ?Infant Disposition:home with mother ?Discharge instruction: per After Visit Summary and Postpartum booklet. ?Activity: Advance as tolerated. Pelvic rest for 6 weeks.  ?Diet: routine diet ?Future Appointments: ?Future Appointments  ?Date Time Provider [place]  ?12/29/2021  1:35 PM Clarnce Flock, MD [facility]  ? ?Follow up Visit: ? ? ?Please schedule this patient for a In person postpartum visit in 4 weeks with the following provider: Any provider. ?Additional Postpartum F/U: IUD string check at [facility] visit   ?Low risk pregnancy complicated by:  ?Delivery mode:  Vaginal, Spontaneous  ?Anticipated Birth Control:  PP IUD placed   ? ? ?11/20/2021 ?Patriciaann Clan, DO ? ? ? ?
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
## 2021-11-18 NOTE — Progress Notes (Signed)
Patient was assessed at bedside ? ?Pitocin was turned off at 1000 due to recurrent late decelerations. Patient also with low blood pressures at the time. Decels  improved with phenylephrine x2, IV fluid bolus.  ? ?At this time tracing improved and now Cat I and therefore will restart pitocin.  ? ?Warner Mccreedy, MD, MPH ?OB Fellow, Faculty Practice ? ?

## 2021-11-18 NOTE — Progress Notes (Signed)
Assess patient at bedside.  ? ?Feeling chills and vomiting. Also had some variable decelerations ? ?Cervix checked: ?6-7/80/-2to-1 ? ?Progressing from last check. Was 4.5 around 0900. Pitocin was off from till 1000-1300 for recurrent late decelerations. Pitocin now on 6. Having some variables and if recurrent can do amnioinfusion. ? ?If tracing appropriate can uptitrate pitocin as needed to achieve adequate contractions.  ? ?Warner Mccreedy, MD, MPH ?OB Fellow, Faculty Practice ? ?

## 2021-11-18 NOTE — Progress Notes (Signed)
Labor Progress Note  ? ?Checked in on patient. Doing well with irregular contractions on pit (FB fell out a few hours ago). She has had a few contractions that have been uncomfortable and would like to proceed with epidural placement at this time.  ? ?Plan to postpone check until after epidural placement, hopefully with AROM then as well. FHT Cat I.  ? ?Allayne Stack, DO  ?

## 2021-11-18 NOTE — Procedures (Signed)
?  Post-Placental IUD Insertion Procedure Note ? ?Patient identified, informed consent signed prior to delivery, signed copy in chart, time out was performed.   ? ?Vaginal, labial and perineal areas thoroughly inspected for lacerations. 1 degree laceration identified - not hemostatic,repaired prior to insertion of IUD.  Mirena- IUD inserted with inserter per manufacturer's instructions.  ?  ?Strings trimmed to the level of the introitus. Patient tolerated procedure well. ? ?Lot # [id number] ?Expiration Date4/2025 ? ?Patient given post procedure instructions and IUD care card with expiration date.  Patient is asked to keep IUD strings tucked in her vagina until her postpartum follow up visit in 4-6 weeks. Patient advised to abstain from sexual intercourse and pulling on strings before her follow-up visit. Patient verbalized an understanding of the plan of care and agrees.  ? ?

## 2021-11-18 NOTE — Progress Notes (Signed)
LABOR PROGRESS NOTE ? ?Subjective: Patient is comfortable at bs with epidural on board. Reports mild consistent rectal pressure and some s/s of reflux.  ? ?Objective: ?Blood pressure (!) 95/51, pulse 95, temperature 99.3 ?F (37.4 ?C), temperature source Axillary, resp. rate 16, height 5\' 1"  (1.549 m), weight 59.8 kg, last menstrual period 02/10/2021, SpO2 100 %. ?Temp (24hrs), Avg:98.6 ?F (37 ?C), Min:97.9 ?F (36.6 ?C), Max:99.3 ?F (37.4 ?C) ? ? ?Fetal Heart Tones ?Baseline: 130 ?Variability: moderate ?Accelerations: absent ?Decelerations: absent ?Overall assessment: cat 1 ? ?Contractions: 1-3.65min ? ?Cervical Exam: 10/100%/+1  ? ?ASSESSMENT AND PLAN:  Normal IUP at [redacted]w[redacted]d in 2nd stage of labor ? ?Encourage laboring down to facilitate fetal descent  ?Initiate pushing efforts PRN ? ?Anticipate SVD ? ?Electronically Signed By: ?[redacted]w[redacted]d, SNM ?11/18/21 8:52 PM   ?

## 2021-11-18 NOTE — Progress Notes (Signed)
Labor Progress Note ?Cassandra Bowen is a 31 y.o. G2P0010 at [redacted]w[redacted]d presented for pd IOL.  ? ?S: Doing well, comfortable with epidural. Came to bedside as she was due for a check and noted recurrent late decelerations. Pit currently at 14 and resolved with placing her on the left side (previously on her back).  ? ?O:  ?BP 114/69   Pulse (!) 105   Temp 98.2 ?F (36.8 ?C) (Oral)   Resp 16   Ht 5\' 1"  (1.549 m)   Wt 59.8 kg   LMP 02/10/2021 (Exact Date)   SpO2 98%   BMI 24.92 kg/m?  ?EFM: 130/mod/15x15 ? ?CVE: Dilation: 4 ?Effacement (%): 80 ?Cervical Position: Posterior ?Station: -3 ?Presentation: Vertex ?Exam by:: Dr. [email or web address] ? ? ?A&P: 31 y.o. G2P0010 [redacted]w[redacted]d  ?#Labor: Some progression since last check. Head well applied. After verbal consent, performed AROM with moderate amount of clear fluid. Head remained well applied to cervix afterwards. She had two additional late decelerations with concurrent tachysystole of contractions, so pit was turned off. FHT improved with left lateral placement. Will monitor FHT and likely restart pit in the next 30 minutes if reassuring/ctx spaced.  ?#Pain: Epidural  ?#FWB: Cat II >> however improving/reassuring following management as above.  ?#GBS positive ? ?[redacted]w[redacted]d, DO ? ?

## 2021-11-19 LAB — CBC
HCT: 32.3 % — ABNORMAL LOW (ref 36.0–46.0)
Hemoglobin: 10.9 g/dL — ABNORMAL LOW (ref 12.0–15.0)
MCH: 29.8 pg (ref 26.0–34.0)
MCHC: 33.7 g/dL (ref 30.0–36.0)
MCV: 88.3 fL (ref 80.0–100.0)
Platelets: 319 10*3/uL (ref 150–400)
RBC: 3.66 MIL/uL — ABNORMAL LOW (ref 3.87–5.11)
RDW: 14.6 % (ref 11.5–15.5)
WBC: 25.1 10*3/uL — ABNORMAL HIGH (ref 4.0–10.5)
nRBC: 0.1 % (ref 0.0–0.2)

## 2021-11-19 MED ORDER — PRENATAL MULTIVITAMIN CH
1.0000 | ORAL_TABLET | Freq: Every day | ORAL | Status: DC
  Administered 2021-11-19 – 2021-11-20 (×2): 1 via ORAL
  Filled 2021-11-19 (×2): qty 1

## 2021-11-19 MED ORDER — IBUPROFEN 600 MG PO TABS
600.0000 mg | ORAL_TABLET | Freq: Four times a day (QID) | ORAL | Status: DC
  Administered 2021-11-19 – 2021-11-20 (×7): 600 mg via ORAL
  Filled 2021-11-19 (×7): qty 1

## 2021-11-19 MED ORDER — ONDANSETRON HCL 4 MG/2ML IJ SOLN: 4.0000 mg | INTRAMUSCULAR | Status: DC | PRN

## 2021-11-19 MED ORDER — COCONUT OIL OIL: 1.0000 "application " | TOPICAL_OIL | Status: DC | PRN

## 2021-11-19 MED ORDER — FERROUS SULFATE 325 (65 FE) MG PO TABS
325.0000 mg | ORAL_TABLET | ORAL | Status: DC
  Administered 2021-11-19: 325 mg via ORAL
  Filled 2021-11-19: qty 1

## 2021-11-19 MED ORDER — MEASLES, MUMPS & RUBELLA VAC IJ SOLR
0.5000 mL | Freq: Once | INTRAMUSCULAR | Status: DC
Start: 2021-11-19 — End: 2021-11-20

## 2021-11-19 MED ORDER — DIBUCAINE (PERIANAL) 1 % EX OINT: 1.0000 "application " | TOPICAL_OINTMENT | CUTANEOUS | Status: DC | PRN

## 2021-11-19 MED ORDER — METHYLERGONOVINE MALEATE 0.2 MG PO TABS: 0.2000 mg | ORAL_TABLET | ORAL | Status: DC | PRN

## 2021-11-19 MED ORDER — ONDANSETRON HCL 4 MG PO TABS: 4.0000 mg | ORAL_TABLET | ORAL | Status: DC | PRN

## 2021-11-19 MED ORDER — ACETAMINOPHEN 325 MG PO TABS
650.0000 mg | ORAL_TABLET | ORAL | Status: DC | PRN
  Administered 2021-11-19: 650 mg via ORAL
  Filled 2021-11-19: qty 2

## 2021-11-19 MED ORDER — SIMETHICONE 80 MG PO CHEW: 80.0000 mg | CHEWABLE_TABLET | ORAL | Status: DC | PRN

## 2021-11-19 MED ORDER — TETANUS-DIPHTH-ACELL PERTUSSIS 5-2.5-18.5 LF-MCG/0.5 IM SUSY: 0.5000 mL | PREFILLED_SYRINGE | Freq: Once | INTRAMUSCULAR | Status: DC

## 2021-11-19 MED ORDER — WITCH HAZEL-GLYCERIN EX PADS: 1.0000 "application " | MEDICATED_PAD | CUTANEOUS | Status: DC | PRN

## 2021-11-19 MED ORDER — SENNOSIDES-DOCUSATE SODIUM 8.6-50 MG PO TABS
2.0000 | ORAL_TABLET | Freq: Every day | ORAL | Status: DC
  Administered 2021-11-19 – 2021-11-20 (×2): 2 via ORAL
  Filled 2021-11-19 (×2): qty 2

## 2021-11-19 MED ORDER — DOCUSATE SODIUM 100 MG PO CAPS
100.0000 mg | ORAL_CAPSULE | Freq: Two times a day (BID) | ORAL | Status: DC
  Administered 2021-11-19 – 2021-11-20 (×3): 100 mg via ORAL
  Filled 2021-11-19 (×4): qty 1

## 2021-11-19 MED ORDER — BENZOCAINE-MENTHOL 20-0.5 % EX AERO
1.0000 "application " | INHALATION_SPRAY | CUTANEOUS | Status: DC | PRN
  Filled 2021-11-19: qty 56

## 2021-11-19 MED ORDER — METHYLERGONOVINE MALEATE 0.2 MG/ML IJ SOLN: 0.2000 mg | INTRAMUSCULAR | Status: DC | PRN

## 2021-11-19 MED ORDER — MEDROXYPROGESTERONE ACETATE 150 MG/ML IM SUSP
150.0000 mg | INTRAMUSCULAR | Status: DC | PRN
Start: 2021-11-19 — End: 2021-11-20

## 2021-11-19 MED ORDER — DIPHENHYDRAMINE HCL 25 MG PO CAPS: 25.0000 mg | ORAL_CAPSULE | Freq: Four times a day (QID) | ORAL | Status: DC | PRN

## 2021-11-19 NOTE — Progress Notes (Addendum)
PP day 1 ? ?Subjective     ? ?Cassandra Bowen is doing well. Pain is well controlled with po pain medication and topical perineal preparations. She denies severe pain, heavy bleeding, palpitations, or sob. Ambulating and voiding without difficulty. Method of Feeding: breast /formula. Breastfeeding is going well. She feels that her IUD has come out because she cannot feel the strings. ? ?  ?Objective  ? ?BP (!) 115/52 (BP Location: Left Arm)   Pulse 100   Temp 98.1 ?F (36.7 ?C) (Oral)   Resp 18   Ht 5\' 1"  (1.549 m)   Wt 59.8 kg   LMP 02/10/2021 (Exact Date)   SpO2 100%   Breastfeeding Unknown   BMI 24.92 kg/m?   ? ?Physical Exam ? ?Fundus: Firm, Below umbilicus ?Lochia: scant ?Perineum: intact, no edema present.  ?Breasts: non-engorged, nipples without cracks ?Extremities: no edema, no varices ? ?11/17/21: WBC 7.4, hgb 12.4 ?11/19/21: WBC 25.1, hgb 10.9 ? ?  ?Assessment ? ?31 y.o. G2P1011 postpartum day 1 status post vaginal delivery without complications. Pregnancy course was complicated by +GBS culture and silent alpha thalassemia carrier. ? ?Plan ?Continue routine PP care ?CBC redraw at 0500 ? ?Postpartum Teaching:   ?Discussed with patient appropriate postpartum nutrition, hydration, breastfeeding, activity, avoiding vaginal insertion until lochia complete, and typical postpartum course. Patient advised to call with fever/chills, excessive bleeding or passing large clots, uncontrolled pain, s/s of postpartum depression, or s/s of DVT. Counseled to not attempt to feel IUD strings to mitigate infection risk.  ?Plan D/C Tomorrow ? ? ?Electronically Signed by: ?26, SNM ?11/19/21 8:06 AM  ? ?I have seen and examined this patient and agree the above assessment.  ?Respiratory effort normal, lochia appropriate, legs negative,  pain level normal. ? ?01/19/22 ?11/19/2021 ?1:33 PM ? ?

## 2021-11-19 NOTE — Anesthesia Postprocedure Evaluation (Signed)
Anesthesia Post Note ? ?Patient: Cassandra Bowen ? ?Procedure(s) Performed: AN AD HOC LABOR EPIDURAL ? ?  ? ?Patient location during evaluation: Mother Baby ?Anesthesia Type: Epidural ?Level of consciousness: awake and alert ?Pain management: pain level controlled ?Vital Signs Assessment: post-procedure vital signs reviewed and stable ?Respiratory status: spontaneous breathing, nonlabored ventilation and respiratory function stable ?Cardiovascular status: stable ?Postop Assessment: no headache, no backache, epidural receding, no apparent nausea or vomiting, patient able to bend at knees, adequate PO intake and able to ambulate ?Anesthetic complications: no ? ? ?No notable events documented. ? ?Last Vitals:  ?Vitals:  ? 11/19/21 0100 11/19/21 0534  ?BP: 109/74 (!) 115/52  ?Pulse: 100 100  ?Resp: 18 18  ?Temp: 36.7 ?C   ?SpO2:    ?  ?Last Pain:  ?Vitals:  ? 11/19/21 0739  ?TempSrc:   ?PainSc: Asleep  ? ?Pain Goal:   ? ?  ?  ?  ?  ?  ?  ?  ? ?[name] ? ? ? ? ?

## 2021-11-19 NOTE — Lactation Note (Signed)
This note was copied from a baby's chart. ?Lactation Consultation Note ? ?Patient Name: Cassandra Bowen ?Today's Date: 11/19/2021 ?Reason for consult: Follow-up assessment;Mother's request;Difficult latch;Term;Breastfeeding assistance ?Age:29 hours ? ?Mom stated multiple attempts to latch but infant been sleepy. LC assisted with hand expression and offering 3 ml colostrum. We then latched infant in with signs of milk transfer. Infant still feeding at the end of the visit.  ?Mom unaware yellow to blue color change indicated urine and may have missed some earlier today. LC reviewed with parents charting feedings, urine and stool output.  ? ? ?Plan 1. To feed based on cues 8-12x 24hr period. Mom to offer breasts and look for signs of milk transfer.  ?2. Mom to supplement with EBM via spoon greater 5 ml than with slow flow nipple with pace bottle feeding.  ?3. Manual pump q 3hrs for 10 min each breast.  ? ?All questions answered at the end of the visit.  ? ?Maternal Data ?Has patient been taught Hand Expression?: Yes ? ?Feeding ?Mother's Current Feeding Choice: Breast Milk ? ?LATCH Score ?Latch: Repeated attempts needed to sustain latch, nipple held in mouth throughout feeding, stimulation needed to elicit sucking reflex. ? ?Audible Swallowing: Spontaneous and intermittent ? ?Type of Nipple: Everted at rest and after stimulation ? ?Comfort (Breast/Nipple): Soft / non-tender ? ?Hold (Positioning): Assistance needed to correctly position infant at breast and maintain latch. ? ?LATCH Score: 8 ? ? ?Lactation Tools Discussed/Used ?Tools: Pump;Flanges ?Flange Size: 24 ?Breast pump type: Manual ?Pump Education: Setup, frequency, and cleaning;Milk Storage ?Reason for Pumping: increase stimulation ?Pumping frequency: every 3 hrs for 10 min each breast ? ?Interventions ?Interventions: Breast feeding basics reviewed;Assisted with latch;Skin to skin;Breast massage;Hand express;Breast compression;Position options;Expressed  milk;Hand pump;Education;Pace feeding;Infant Driven Feeding Algorithm education ? ?Discharge ?Pump: Manual ? ?Consult Status ?Consult Status: Follow-up ?Date: 11/20/21 ?Follow-up type: In-patient ? ? ? ?Glenda Spelman  Nicholson-Springer ?11/19/2021, 10:53 PM ? ? ? ?
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
## 2021-11-20 LAB — CBC
HCT: 27.5 % — ABNORMAL LOW (ref 36.0–46.0)
Hemoglobin: 8.9 g/dL — ABNORMAL LOW (ref 12.0–15.0)
MCH: 29.1 pg (ref 26.0–34.0)
MCHC: 32.4 g/dL (ref 30.0–36.0)
MCV: 89.9 fL (ref 80.0–100.0)
Platelets: 284 10*3/uL (ref 150–400)
RBC: 3.06 MIL/uL — ABNORMAL LOW (ref 3.87–5.11)
RDW: 14.7 % (ref 11.5–15.5)
WBC: 15.3 10*3/uL — ABNORMAL HIGH (ref 4.0–10.5)
nRBC: 0.2 % (ref 0.0–0.2)

## 2021-11-20 MED ORDER — ACETAMINOPHEN 325 MG PO TABS: 650.0000 mg | ORAL_TABLET | ORAL | Status: AC | PRN

## 2021-11-20 MED ORDER — FERROUS SULFATE 325 (65 FE) MG PO TABS
325.0000 mg | ORAL_TABLET | ORAL | 0 refills | Status: AC
Start: 2021-11-21 — End: ?

## 2021-11-20 MED ORDER — IBUPROFEN 600 MG PO TABS: 600.0000 mg | ORAL_TABLET | Freq: Four times a day (QID) | ORAL | 0 refills | Status: AC | PRN

## 2021-11-20 NOTE — Progress Notes (Signed)
[facility] ? ?11/20/2021 ? ?Antony Blackbird ?12-11-1990 ?[id number] ? ?        [facility] ? ?11/20/2021 ? ?[name] ?11/18/2020 ?[id number] ? ? ?Current Barriers:  ?Umbilical Cord Tissue Drug Screen Results:  Still pending as of 4:45 pm on 11/20/2021. ?Newborn Rapid Urine Drug Screen Results:  None detected. ?MOB "admitted to smoking marijuana during pregnancy, prior to learning about pregnancy". ?Unmet substance treatment need. ?MOB needs support, education, and care coordination, in order to meet this unmet need. ?Stage of Change: Denies need for counseling and/or a referral for substance abuse treatment services. ?Clinical Goal(s):  ?MOB will reduce self-medicating behavior and increase ability of: Coping skills, healthy habits, and self-management skills.   ?MOB verbalizes understanding of plan for management of Tetrahydrocannabinol. ?Explored community resource options for unmet needs related to Tetrahydrocannabinol. ?MOB verbalized basic understanding of Tetrahydrocannabinol.   ?MOB will attend all scheduled medical appointments, as evidenced by patient report and care team review of appointment completion in EMR. ?Interventions:  ?Collaboration with Attending Physician, Dr. Scheryl Darter, via Epic note.   ?Inter-disciplinary care team collaboration, via Epic note. ?Assessed MOB's understanding of Tetrahydrocannabinol use, previous treatment, and what they would like to change. ?Assessed MOB's overall care coordination needs and provided basic substance abuse education.  ?Assessed for Suicidal Ideation/Homicidal Ideation:  None present. ?Completed screening, brief Intervention, and encouraged referral for substance abuse treatment, as well as support group involvement. ?Substance use treatment information provided. ?Explained to [facility] referral placement to Child Protective Services, with the [facility], per  hospital drug screen policy, on 11/20/2021, if CDS positive for Tetrahydrocannabinol. ?Solution-Focused Strategies employed. ?Active listening / Reflection utilized.  ?Emotional Support provided. ?Participation in substance abuse counseling encouraged. ?Participation in substance abuse support group encouraged.  ?MOB verbalizes understanding of hospital drug screen policy for reporting drug exposed infants/newborns. ?Patient Goals:  ?Implement interventions discussed today. ?Refrain from Tetrahydrocannabinol use/abuse ?Refrain from exposing infant/newborn to Tetrahydrocannabinol. ? ?Danford Bad, BSW, MSW, LCSW  ?Licensed Clinical Social Worker  ?[facility]  ?Mailing Address-[address], [place], [place] [address] ?Physical Address-[address] [place], [place], [place] [address] ?Toll Free Main # [phone] ?Fax # [phone] ?Cell # [phone] ?[email or web address] ?

## 2021-11-20 NOTE — Lactation Note (Signed)
This note was copied from a baby's chart. ?Lactation Consultation Note ? ?Patient Name: Cassandra Bowen ?Today's Date: 11/20/2021 ?Reason for consult: Follow-up assessment;Primapara;1st time breastfeeding;Term;Infant weight loss;Breastfeeding assistance;Other (Comment) (per mom the 1st 3 stools were also wet , the lines were blue . breast are fuller. LC obs a feeding , mom latched the baby by herself with depth, comfort, and swallows noted. LC reviewed the doc flow sheets with mom and updated.)Latch 9 ( see below)  ?Age:78 hours / dad and grandmother at bedside.  ?Mom mentioned she doesn't latch as well on the left breast . LC offered to assess and note areola edema. LC recommended prior to latching until the baby is latching as well as she is on the right. Step for latching - breast massage, hand express, pre- pump with the hand pump to prime the milk ducts and stretch the nipple / areola complex. Latch with firm support. LC provided shells with instructions.  ?LC reviewed breast feeding goals for 24 hours - feed with feeding cues and by 3 hours if the baby isn't showing feeding cues to check diaper, change if needed, STS and offer the breast. If the baby is still hungry after feeding the 1st breast offer the 2nd breast if not and the milk is in - release the 2nd breast down to comfort.   ?( 8-12 feedings a day ).  ?LC reviewed BF D/C teaching and basics.  ?Mom is aware of LC resources after D/C.  ? ?Maternal Data ?  ? ?Feeding ?Mother's Current Feeding Choice: Breast Milk and Formula ? ?LATCH Score ?Latch: Grasps breast easily, tongue down, lips flanged, rhythmical sucking. ? ?Audible Swallowing: Spontaneous and intermittent ? ?Type of Nipple: Everted at rest and after stimulation ? ?Comfort (Breast/Nipple): Filling, red/small blisters or bruises, mild/mod discomfort ? ?Hold (Positioning): No assistance needed to correctly position infant at breast. ? ?LATCH Score: 9 ? ? ?Lactation Tools Discussed/Used ?Tools:  Pump ?Flange Size: 24;27 ?Breast pump type: Manual ?Pump Education: Setup, frequency, and cleaning;Milk Storage ?Reason for Pumping: PRN/ and for the left breast - after breast massage, hand express, pre pump to prime the milk ducts and to stretch the nipple / areola complex for a deeper latch. ? ?Interventions ?Interventions: Breast feeding basics reviewed;Assisted with latch;Skin to skin;Breast massage;Breast compression;Adjust position;Support pillows;Position options;Shells;Hand pump;Education;LC Services brochure ? ?Discharge ?Discharge Education: Engorgement and breast care;Warning signs for feeding baby ?Pump: Manual;Personal;DEBP ? ?Consult Status ?Consult Status: Complete ?Date: 11/20/21 ? ? ? ?Matilde Sprang Teasha Murrillo ?11/20/2021, 12:12 PM ? ? ? ?

## 2021-11-29 ENCOUNTER — Telehealth (HOSPITAL_COMMUNITY): Payer: Self-pay | Admitting: *Deleted

## 2021-11-29 NOTE — Telephone Encounter (Signed)
Mom reports feeling good. No concerns about herself at this time. EPDS=1 [facility] score=2) Mom reports baby is doing well. Feeding, peeing, and pooping without difficulty. Safe sleep reviewed. Mom reports no concerns about baby at present.  Duffy Rhody, RN 11-29-2021 at 9:43am

## 2021-12-29 ENCOUNTER — Ambulatory Visit: Payer: Medicaid Other | Admitting: Family Medicine

## 2022-01-12 ENCOUNTER — Ambulatory Visit: Payer: Medicaid Other | Admitting: Family Medicine

## 2022-02-21 ENCOUNTER — Other Ambulatory Visit: Payer: Self-pay

## 2022-02-21 ENCOUNTER — Ambulatory Visit (INDEPENDENT_AMBULATORY_CARE_PROVIDER_SITE_OTHER): Payer: Medicaid Other | Admitting: Family Medicine

## 2022-02-21 DIAGNOSIS — Z30431 Encounter for routine checking of intrauterine contraceptive device: Secondary | ICD-10-CM

## 2022-02-21 NOTE — Progress Notes (Unsigned)
Post Partum Visit Note  Cassandra Bowen is a 31 y.o. G62P1011 female who presents for a postpartum visit. She is 5 weeks postpartum following a normal spontaneous vaginal delivery.  I have fully reviewed the prenatal and intrapartum course. The delivery was at [redacted]w[redacted]d gestational weeks.  Anesthesia: epidural. Postpartum course has been uncomplicated . Baby is doing well. Baby is feeding by both breast and bottle - Gerber Gentle . Bleeding staining only. Bowel function is normal. Bladder function is normal. Patient is not sexually active. Contraception method is IUD. Postpartum depression screening: negative.   The pregnancy intention screening data noted above was reviewed. Potential methods of contraception were discussed. The patient elected to proceed with post placental IUD.  Edinburgh Postnatal Depression Scale - 02/21/22 1633       Edinburgh Postnatal Depression Scale:  In the Past 7 Days   I have been able to laugh and see the funny side of things. 0    I have looked forward with enjoyment to things. 0    I have blamed myself unnecessarily when things went wrong. 0    I have been anxious or worried for no good reason. 0    I have felt scared or panicky for no good reason. 0    Things have been getting on top of me. 0    I have been so unhappy that I have had difficulty sleeping. 0    I have felt sad or miserable. 0    I have been so unhappy that I have been crying. 0    The thought of harming myself has occurred to me. 0    Edinburgh Postnatal Depression Scale Total 0             Health Maintenance Due  Topic Date Due   Meningococcal B Vaccine (1 of 4 - Increased Risk) Never done   INFLUENZA VACCINE  02/08/2022    The following portions of the patient's history were reviewed and updated as appropriate: allergies, current medications, past family history, past medical history, past social history, past surgical history, and problem list.  Review of Systems Pertinent  items are noted in HPI.  Objective:  BP 117/76   Pulse 86   Wt 112 lb 14.4 oz (51.2 kg)   Breastfeeding Yes   BMI 21.33 kg/m    General:  alert, cooperative, and appears stated age   Breasts:  not indicated  Lungs: clear to auscultation bilaterally  Heart:  regular rate and rhythm, S1, S2 normal, no murmur, click, rub or gallop  Abdomen: soft, non-tender; bowel sounds normal; no masses,  no organomegaly   Wound Patient declined pelvic exam today  GU exam:   Patient declined GU exam today       Assessment:   Patient presents today for postpartum exam.  Currently doing well.  Post placental IUD in place which patient has done manual string checks.  She reports that the strings are not too long and she does not wish them to be cut any shorter.  She declined pelvic exam today but if she has any concerns she return for evaluation.  Plan:   Essential components of care per ACOG recommendations:  1.  Mood and well being: Patient with negative depression screening today. Reviewed local resources for support.  - Patient tobacco use? No.   - hx of drug use? No.    2. Infant care and feeding:  -Patient currently breastmilk feeding? Yes. Discussed returning to work and  pumping. Patient needs a work note. Patient was provided letter for work to allow for every 2-3 hr pumping breaks, and to be granted a private location to express breastmilk and refrigerated area to store breastmilk.  -Social determinants of health (SDOH) reviewed in EPIC. No concerns  3. Sexuality, contraception and birth spacing - Patient does not want a pregnancy in the next year.  Desired family size is 3 children.  - Reviewed reproductive life planning. Reviewed contraceptive methods based on pt preferences and effectiveness.  Patient desired IUD or IUS today.   - Discussed birth spacing of 18 months  4. Sleep and fatigue -Encouraged family/partner/community support of 4 hrs of uninterrupted sleep to help with mood and  fatigue  5. Physical Recovery  - Discussed patients delivery and complications. She describes her labor as good. - Patient had a Vaginal, no problems at delivery. Patient had a 1st degree laceration. Perineal healing reviewed. Patient expressed understanding - Patient has urinary incontinence? No. - Patient is safe to resume physical and sexual activity  6.  Health Maintenance - HM due items addressed Yes - Last pap smear  Diagnosis  Date Value Ref Range Status  05/06/2021   Final   - Negative for intraepithelial lesion or malignancy (NILM)   Pap smear not done at today's visit.  -Breast Cancer screening indicated? No.   7. Chronic Disease/Pregnancy Condition follow up: None  - PCP follow up  Celedonio Savage, MD [facility], [facility]
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
## 2022-04-04 ENCOUNTER — Other Ambulatory Visit: Payer: Self-pay

## 2022-04-04 ENCOUNTER — Encounter: Payer: Self-pay | Admitting: Obstetrics and Gynecology

## 2022-04-04 ENCOUNTER — Ambulatory Visit (INDEPENDENT_AMBULATORY_CARE_PROVIDER_SITE_OTHER): Payer: Medicaid Other | Admitting: Obstetrics and Gynecology

## 2022-04-04 VITALS — BP 113/79 | HR 77 | Wt 112.9 lb

## 2022-04-04 DIAGNOSIS — Z975 Presence of (intrauterine) contraceptive device: Secondary | ICD-10-CM

## 2022-07-01 IMAGING — US US OB < 14 WEEKS - US OB TV
1 series · 15 of 28 positions shown · non-contrast
Comparison: None.

CLINICAL DATA: Positive urine pregnancy test, LMP 09/25/2020,
pelvic pain and vaginal bleeding

EXAM:
OBSTETRIC <14 WK US AND TRANSVAGINAL OB US
TECHNIQUE: Both transabdominal and transvaginal ultrasound examinations were
performed for complete evaluation of the gestation as well as the
maternal uterus, adnexal regions, and pelvic cul-de-sac.
Transvaginal technique was performed to assess early pregnancy.

[Series 1: us ob < 14 weeks - us ob tv · 15 of 59 slices shown]
[im 1/59]
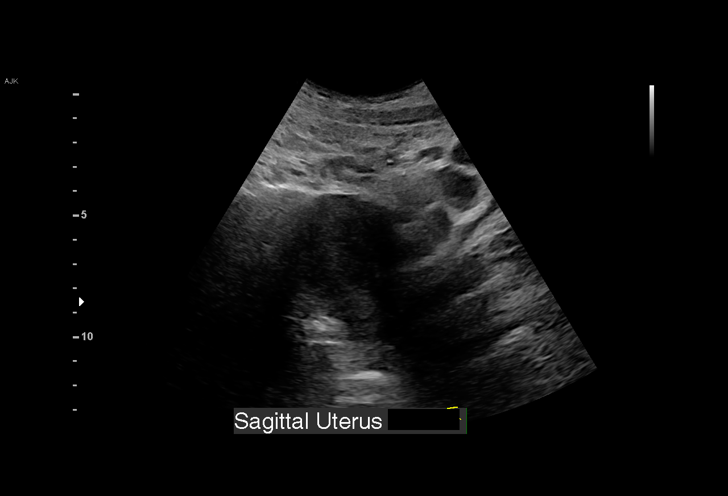
[im 5/59]
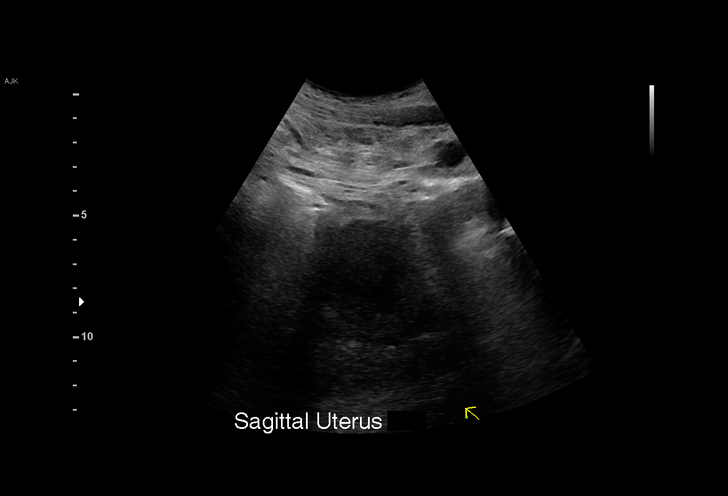
[im 9/59]
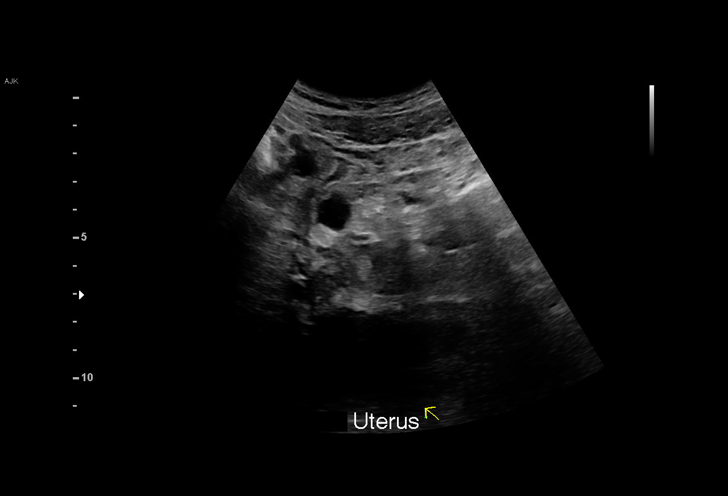
[im 13/59]
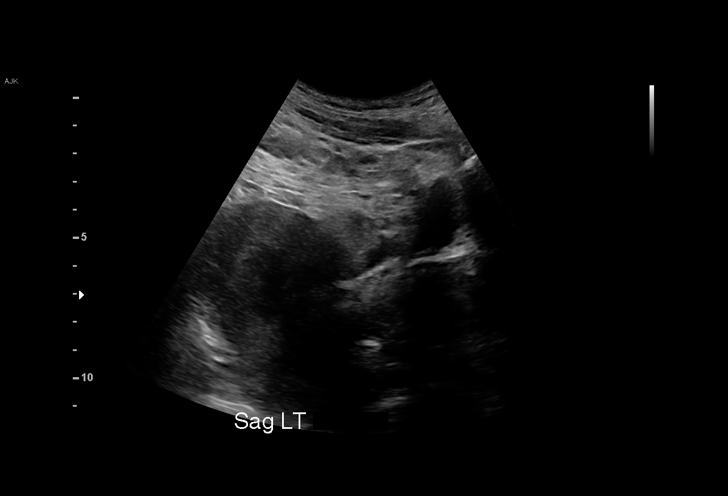
[im 18/59]
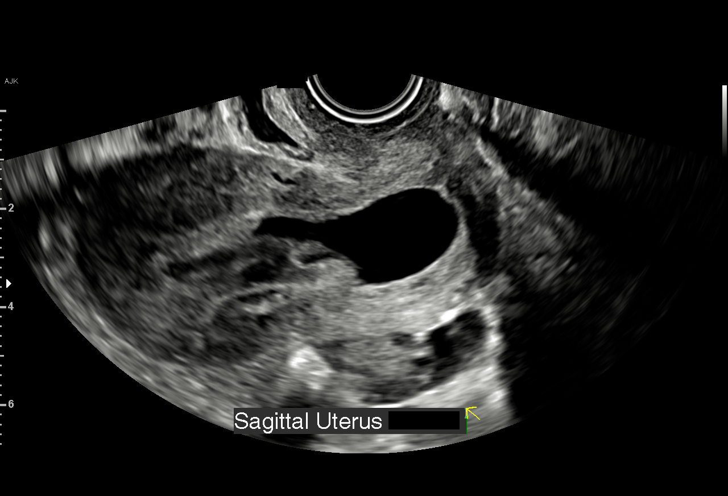
[im 22/59]
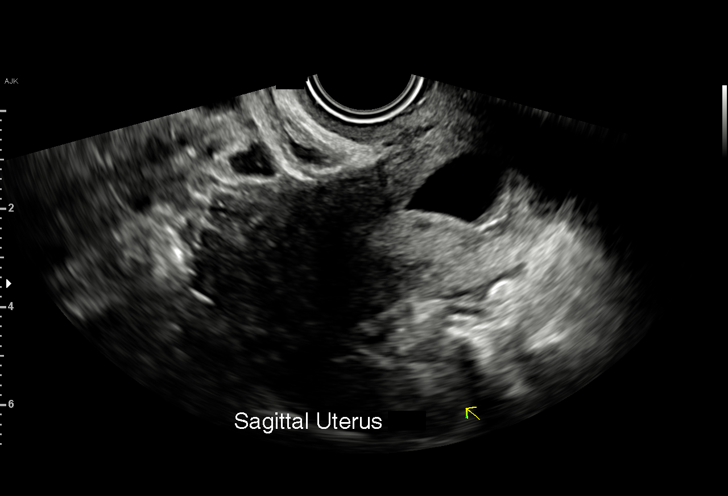
[im 26/59]
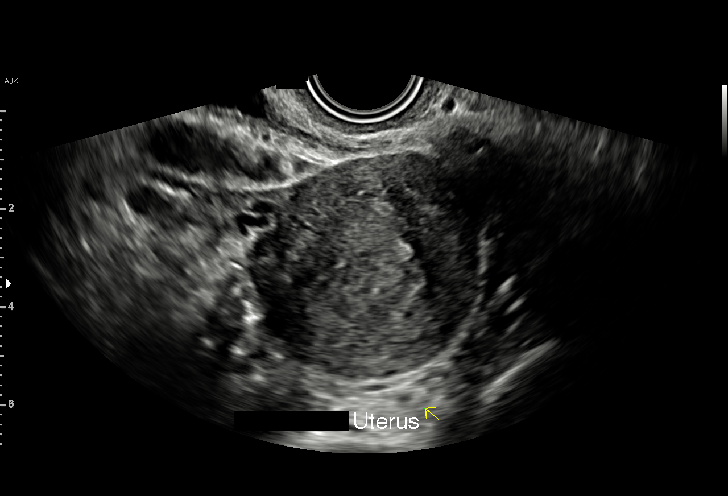
[im 31/59]
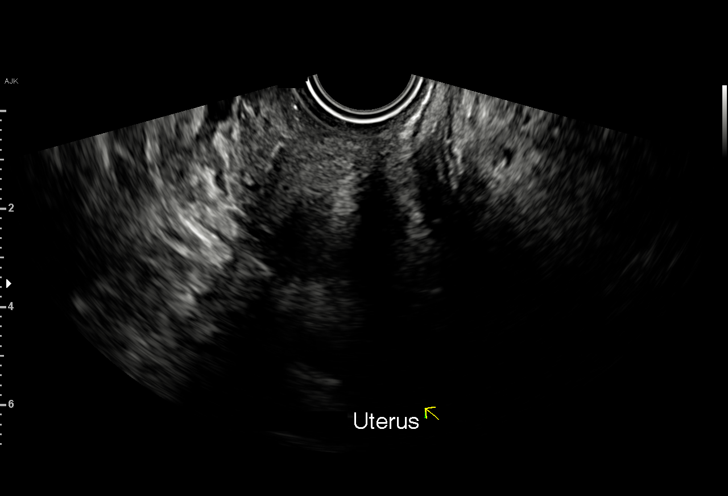
[im 33/59]
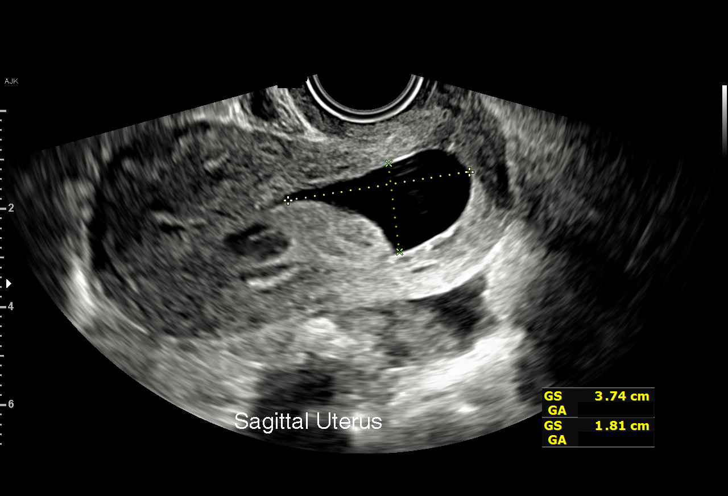
[im 37/59]
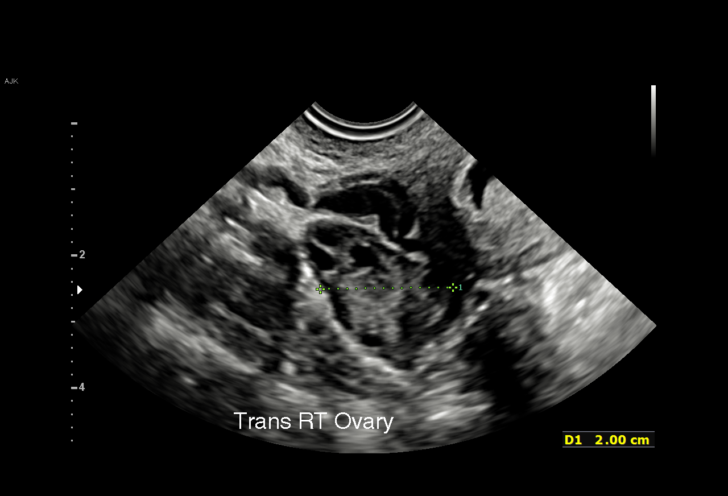
[im 41/59]
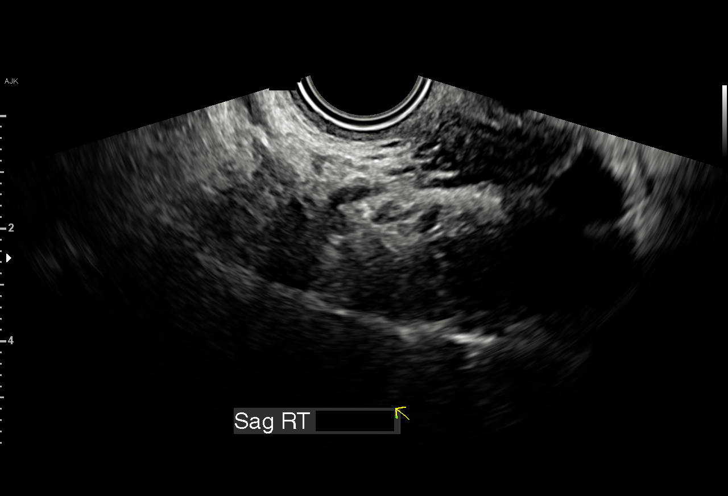
[im 46/59]
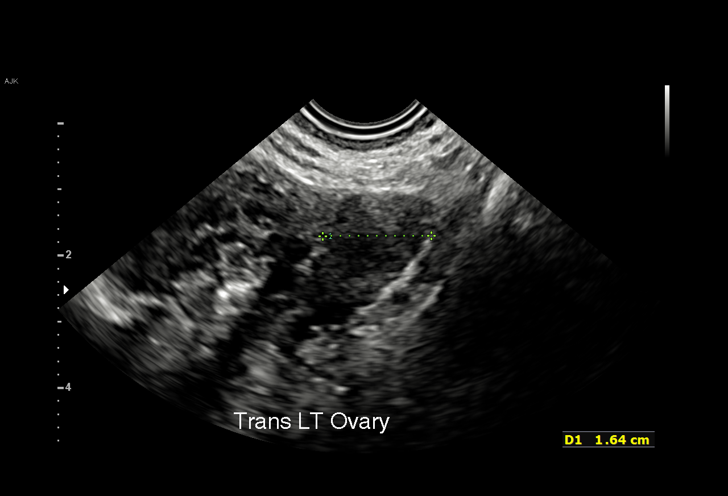
[im 50/59]
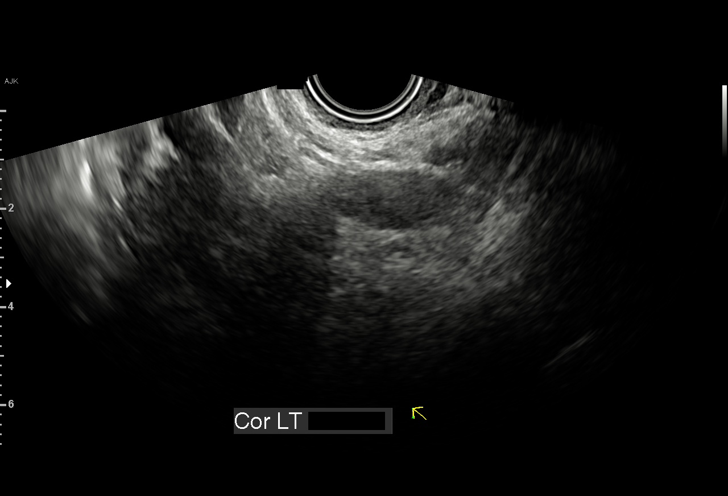
[im 54/59]
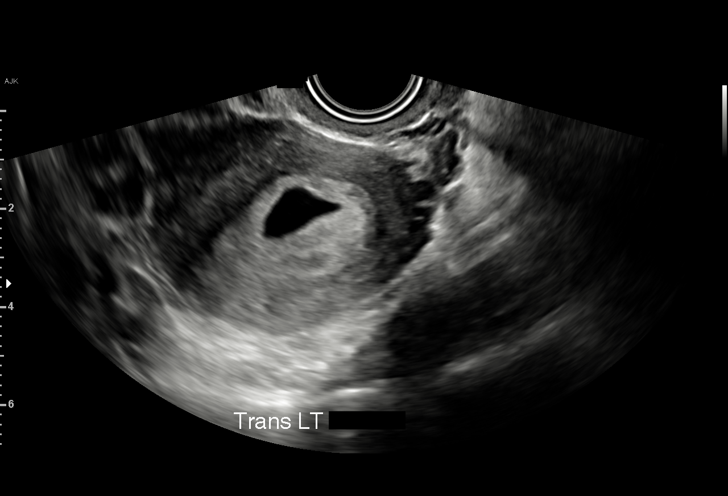
[im 59/59]
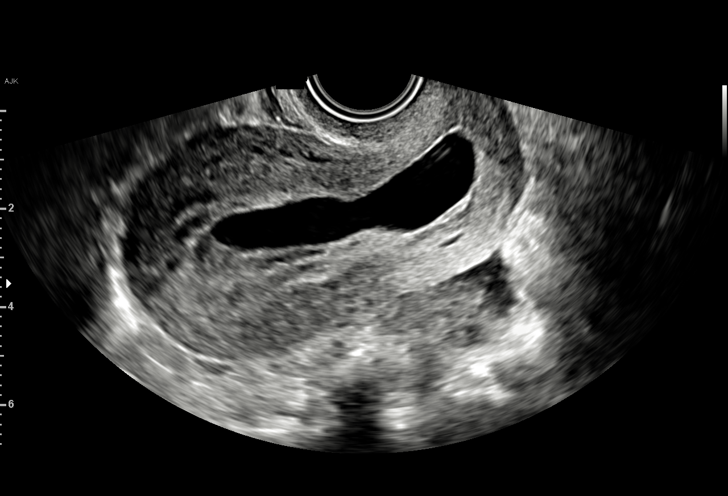

[15 of 28 positions shown; findings below may reference images not displayed]

FINDINGS: Intrauterine gestational sac: Present, single, somewhat elongate and
demonstrating variable shape during the examination secondary to
uterine contraction.

Yolk sac:  Not identified

Embryo:  Not identified

Cardiac Activity: Not applicable

MSD: 25 mm

Subchorionic hemorrhage: A small subchorionic hemorrhage is
identified within the fundus.

Maternal uterus/adnexae: No intrauterine masses are seen. The cervix
is closed though there is mild funneling of the internal cervical
os, best appreciated on cine images. The maternal ovaries are
unremarkable. No adnexal masses are seen. Trace simple appearing
free fluid is seen within the pelvis.
IMPRESSION: Intrauterine gestational sac identified without identifiable fetal
structures or yolk sac. Given the size of the gestational sac, this
is compatible with an anembryonic pregnancy.

Elongated appearance of the gestational sac with funneling of the
internal cervical os and reported uterine contractions during the
examination. This may represent an abortion in progress.

Small subchorionic hemorrhage.

## 2023-02-17 IMAGING — US US MFM OB FOLLOW-UP
1 series · 14 of 28 positions shown · non-contrast
Comparison: none

[Series 1: us mfm ob follow-up · 40 acquisitions, 14 frames shown]
[im 2/40]
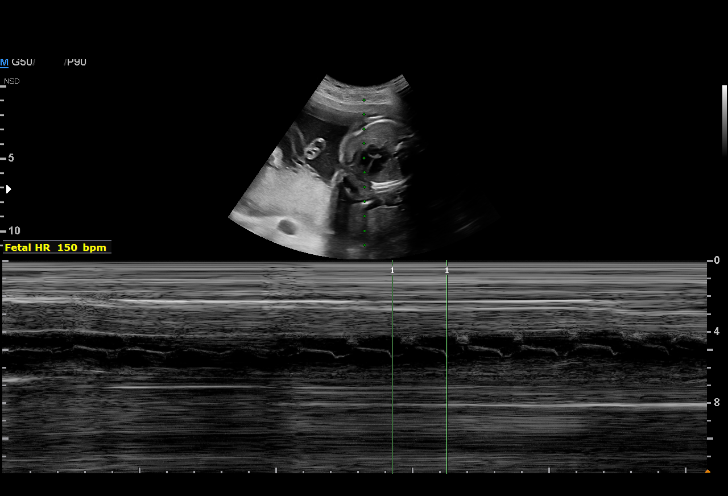
[im 5/40]
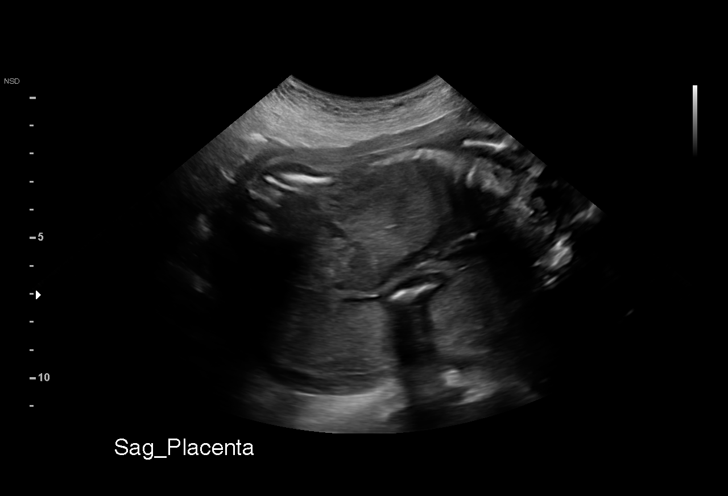
[im 8/40]
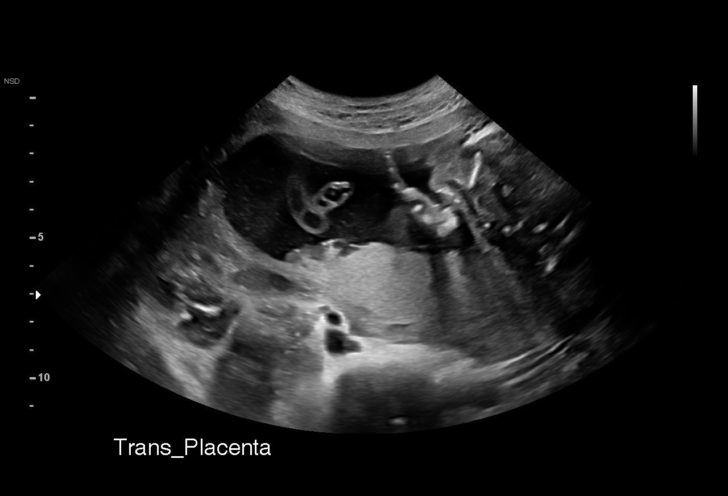
[im 11/40]
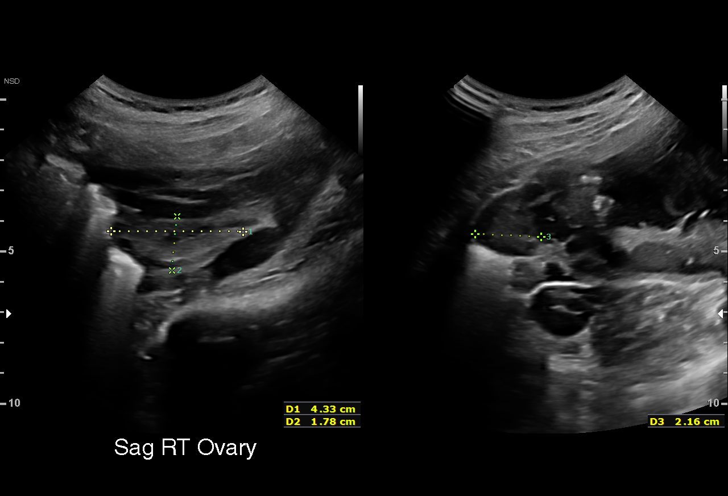
[im 14/40]
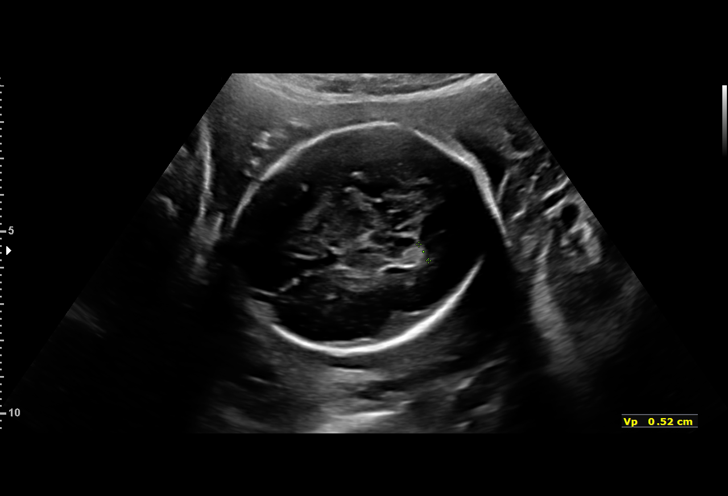
[im 16/40]
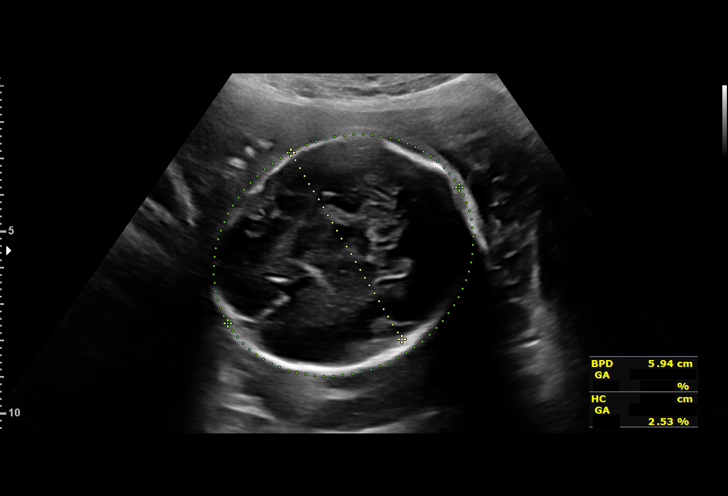
[im 19/40]
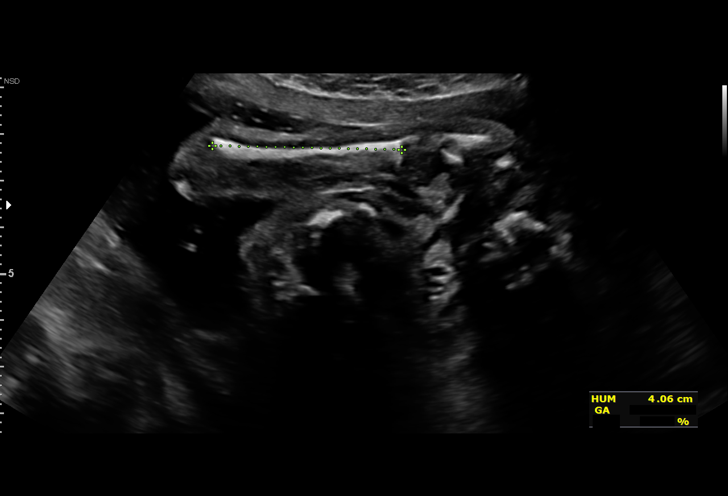
[im 22/40]
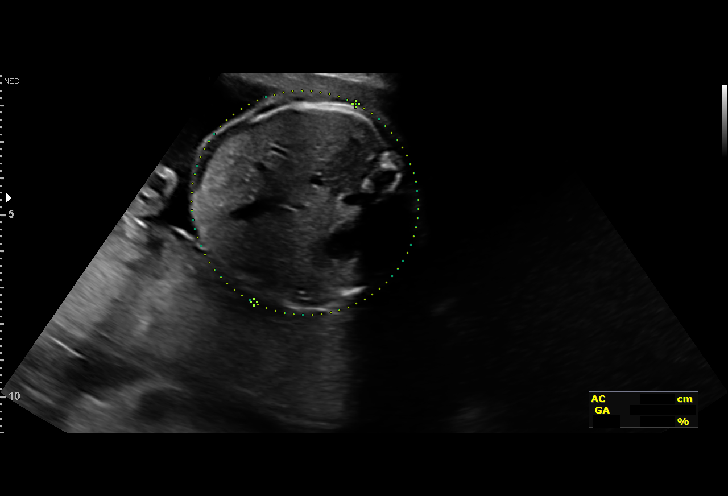
[im 25/40]
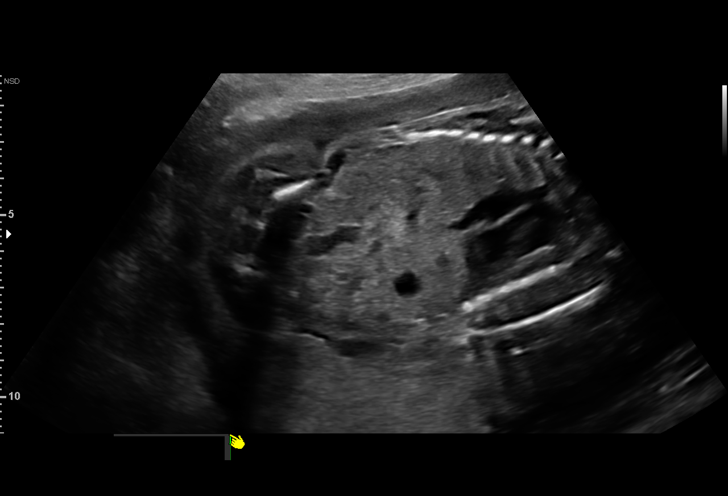
[im 28/40]
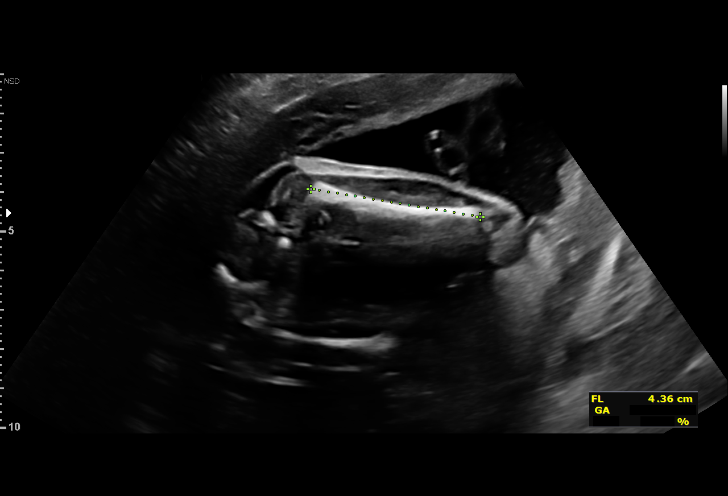
[im 31/40]
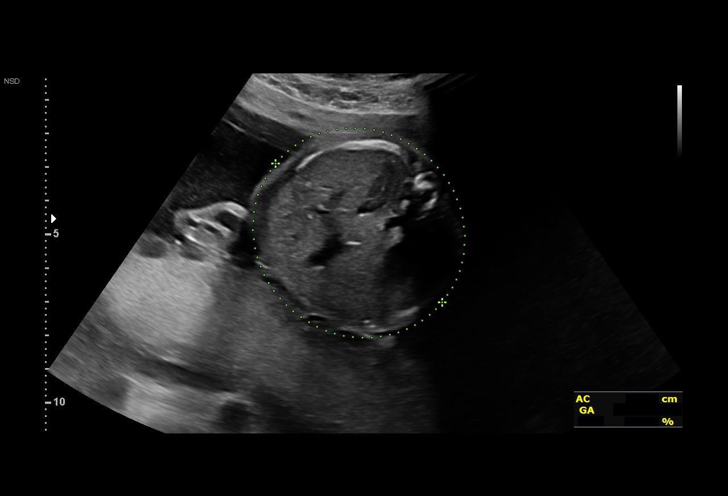
[im 34/40]
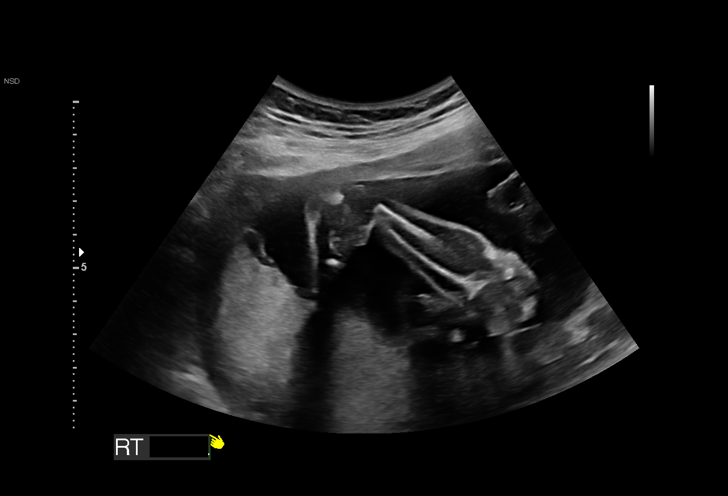
[im 37/40]
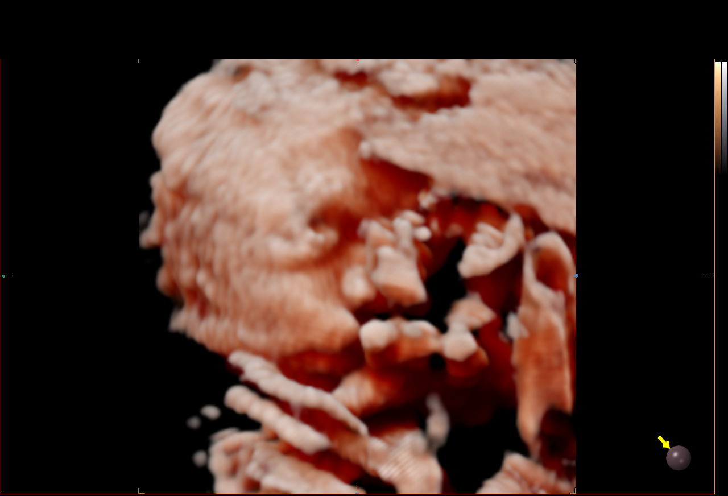
[im 40/40]
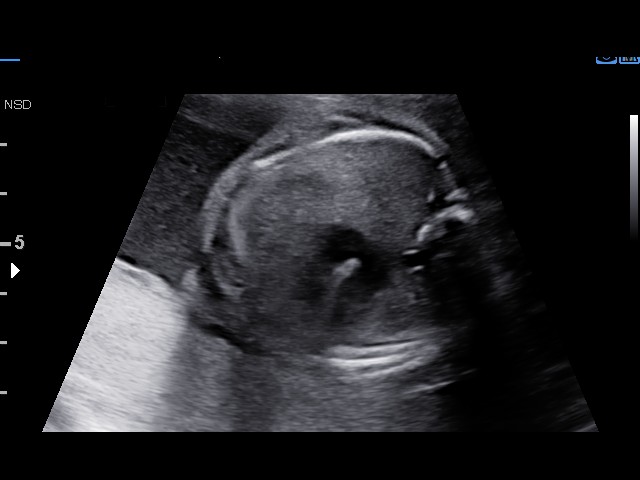

[14 of 28 positions shown; findings below may reference images not displayed]

Indications

 Encounter for other antenatal screening
 follow-up
 25 weeks gestation of pregnancy
 Genetic carrier (Wang Tesfay)
 LR NIPS
Fetal Evaluation

 Num Of Fetuses:         1
 Fetal Heart Rate(bpm):  150
 Cardiac Activity:       Observed
 Presentation:           Cephalic
 Placenta:               Posterior
 P. Cord Insertion:      Previously seen as normal

 Amniotic Fluid
 AFI FV:      Within normal limits

                             Largest Pocket(cm)

Biometry

 BPD:      59.2  mm     G. Age:  24w 1d         16  %    CI:        75.59   %    70 - 86
                                                         FL/HC:      20.1   %    18.7 -
 HC:      215.9  mm     G. Age:  23w 4d        2.5  %    HC/AC:      1.11        1.04 -
 AC:      194.4  mm     G. Age:  24w 1d         17  %    FL/BPD:     73.5   %    71 - 87
 FL:       43.5  mm     G. Age:  24w 2d         17  %    FL/AC:      22.4   %    20 - 24
 HUM:      40.7  mm     G. Age:  24w 5d         35  %
 LV:        5.2  mm

 Est. FW:     665  gm      1 lb 7 oz     11  %
OB History

 Gravidity:    2          SAB:   1
Gestational Age

 LMP:           25w 0d        Date:  02/10/21                 EDD:   11/17/21
 U/S Today:     24w 0d                                        EDD:   11/24/21
 Best:          25w 0d     Det. By:  LMP  (02/10/21)          EDD:   11/17/21
Anatomy

 Cranium:               Appears normal         LVOT:                   Previously seen
 Cavum:                 Previously seen        Aortic Arch:            Previously seen
 Ventricles:            Appears normal         Ductal Arch:            Previously seen
 Choroid Plexus:        Previously seen        Diaphragm:              Appears normal
 Cerebellum:            Previously seen        Stomach:                Appears normal, left
                                                                       sided
 Posterior Fossa:       Previously seen        Abdomen:                Appears normal
 Nuchal Fold:           Previously seen        Abdominal Wall:         Previously seen
 Face:                  Orbits and profile     Cord Vessels:           Previously seen
                        previously seen
 Lips:                  Previously seen        Kidneys:                Appear normal
 Palate:                Not well visualized    Bladder:                Appears normal
 Thoracic:              Appears normal         Spine:                  Previously seen
 Heart:                 Appears normal         Upper Extremities:      Previously seen
                        (4CH, axis, and
                        situs)
 RVOT:                  Previously seen        Lower Extremities:      Previously seen

 Other:  Female gender previously seen. Heels, open hands/5th digits, nasal
         bone, lenses, VC, 3VV and 3VTV previously visualized. Technically
         difficult due to fetal position.
Cervix Uterus Adnexa

 Cervix
 Not visualized (advanced GA >69wks)

 Right Ovary
 Within normal limits.

 Left Ovary
 Within normal limits.

 Adnexa
 No abnormality visualized.
Impression

 Follow up growth due to small for gestational age.
 Normal interval growth with measurements consistent with
 dates, however, the EFW is at the 11%.
 Good fetal movement and amniotic fluid volume
Recommendations

 Follow up growth in 3 weeks.

## 2024-08-16 ENCOUNTER — Ambulatory Visit (INDEPENDENT_AMBULATORY_CARE_PROVIDER_SITE_OTHER)

## 2024-08-16 ENCOUNTER — Ambulatory Visit
Admission: RE | Admit: 2024-08-16 | Discharge: 2024-08-16 | Disposition: A | Discharge status: Home / Self Care | Source: Ambulatory Visit

## 2024-08-16 VITALS — BP 109/70 | HR 88 | Resp 17 | Wt 121.0 lb

## 2024-08-16 DIAGNOSIS — R109 Unspecified abdominal pain: Secondary | ICD-10-CM

## 2024-08-16 LAB — POCT URINE PREGNANCY: Preg Test, Ur: NEGATIVE

## 2024-08-16 LAB — POCT URINE DIPSTICK
Bilirubin, UA: NEGATIVE
Glucose, UA: NEGATIVE mg/dL
Leukocytes, UA: NEGATIVE
Nitrite, UA: NEGATIVE
POC PROTEIN,UA: NEGATIVE
Spec Grav, UA: >=1.03 — AB
Urobilinogen, UA: 1 U/dL
pH, UA: 6
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
## 2024-08-16 NOTE — ED Triage Notes (Signed)
 Pt presents c/o vaginal concern x 1 days. Pt states,  They put birth control in me as soon as I gave birth and now I need to get it out. It hurts real bad.

## 2024-08-16 NOTE — Discharge Instructions (Addendum)
 Xray shows IUD in the general area of uterus but cannot determine if the IUD has punctured through uterus. Since your pain is so significant, I would either call your OB-GYN and see if they can get you in today or report to ED for imagining studies.
